# Patient Record
Sex: Female | Born: 1955
Health system: Southern US, Community
[De-identification: ages and names within clinical notes are randomized; demographics above are authoritative.]

## PROBLEM LIST (undated history)

## (undated) DIAGNOSIS — Z9221 Personal history of antineoplastic chemotherapy: Secondary | ICD-10-CM

## (undated) DIAGNOSIS — C801 Malignant (primary) neoplasm, unspecified: Secondary | ICD-10-CM

## (undated) DIAGNOSIS — F419 Anxiety disorder, unspecified: Secondary | ICD-10-CM

## (undated) DIAGNOSIS — C50919 Malignant neoplasm of unspecified site of unspecified female breast: Secondary | ICD-10-CM

## (undated) HISTORY — PX: AUGMENTATION MAMMAPLASTY: SUR837

---

## 1992-05-03 HISTORY — PX: MASTECTOMY: SHX3

## 1998-01-22 ENCOUNTER — Other Ambulatory Visit: Admission: RE | Admit: 1998-01-22 | Discharge: 1998-01-22 | Payer: Self-pay | Admitting: Obstetrics and Gynecology

## 1998-04-02 ENCOUNTER — Other Ambulatory Visit: Admission: RE | Admit: 1998-04-02 | Discharge: 1998-04-02 | Payer: Self-pay | Admitting: Obstetrics and Gynecology

## 1998-12-19 ENCOUNTER — Encounter (INDEPENDENT_AMBULATORY_CARE_PROVIDER_SITE_OTHER): Payer: Self-pay | Admitting: Specialist

## 1998-12-19 ENCOUNTER — Other Ambulatory Visit: Admission: RE | Admit: 1998-12-19 | Discharge: 1998-12-19 | Payer: Self-pay | Admitting: Obstetrics and Gynecology

## 1999-01-28 ENCOUNTER — Ambulatory Visit (HOSPITAL_COMMUNITY): Admission: RE | Admit: 1999-01-28 | Discharge: 1999-01-28 | Payer: Self-pay | Admitting: Gastroenterology

## 1999-01-28 ENCOUNTER — Encounter (INDEPENDENT_AMBULATORY_CARE_PROVIDER_SITE_OTHER): Payer: Self-pay | Admitting: Specialist

## 1999-02-02 ENCOUNTER — Other Ambulatory Visit: Admission: RE | Admit: 1999-02-02 | Discharge: 1999-02-02 | Payer: Self-pay | Admitting: Obstetrics and Gynecology

## 1999-10-22 ENCOUNTER — Encounter: Payer: Self-pay | Admitting: Oncology

## 1999-10-22 ENCOUNTER — Encounter: Admission: RE | Admit: 1999-10-22 | Discharge: 1999-10-22 | Payer: Self-pay | Admitting: Oncology

## 2000-03-01 ENCOUNTER — Other Ambulatory Visit: Admission: RE | Admit: 2000-03-01 | Discharge: 2000-03-01 | Payer: Self-pay | Admitting: Obstetrics and Gynecology

## 2000-05-03 HISTORY — PX: REDUCTION MAMMAPLASTY: SUR839

## 2000-07-05 ENCOUNTER — Encounter: Admission: RE | Admit: 2000-07-05 | Discharge: 2000-07-05 | Payer: Self-pay | Admitting: Oncology

## 2000-07-05 ENCOUNTER — Encounter: Payer: Self-pay | Admitting: Oncology

## 2000-08-22 ENCOUNTER — Encounter: Admission: RE | Admit: 2000-08-22 | Discharge: 2000-08-22 | Payer: Self-pay | Admitting: Family Medicine

## 2000-08-22 ENCOUNTER — Encounter: Payer: Self-pay | Admitting: Family Medicine

## 2000-09-23 ENCOUNTER — Inpatient Hospital Stay (HOSPITAL_COMMUNITY): Admission: RE | Admit: 2000-09-23 | Discharge: 2000-09-25 | Payer: Self-pay | Admitting: Plastic Surgery

## 2000-09-23 ENCOUNTER — Encounter (INDEPENDENT_AMBULATORY_CARE_PROVIDER_SITE_OTHER): Payer: Self-pay | Admitting: *Deleted

## 2000-12-14 ENCOUNTER — Ambulatory Visit (HOSPITAL_BASED_OUTPATIENT_CLINIC_OR_DEPARTMENT_OTHER): Admission: RE | Admit: 2000-12-14 | Discharge: 2000-12-14 | Payer: Self-pay | Admitting: Plastic Surgery

## 2001-01-31 ENCOUNTER — Encounter: Admission: RE | Admit: 2001-01-31 | Discharge: 2001-01-31 | Payer: Self-pay | Admitting: Oncology

## 2001-01-31 ENCOUNTER — Encounter: Payer: Self-pay | Admitting: Oncology

## 2001-03-07 ENCOUNTER — Other Ambulatory Visit: Admission: RE | Admit: 2001-03-07 | Discharge: 2001-03-07 | Payer: Self-pay | Admitting: Vascular Surgery

## 2001-07-05 ENCOUNTER — Encounter: Payer: Self-pay | Admitting: Family Medicine

## 2001-07-05 ENCOUNTER — Encounter: Admission: RE | Admit: 2001-07-05 | Discharge: 2001-07-05 | Payer: Self-pay | Admitting: Family Medicine

## 2002-02-02 ENCOUNTER — Encounter: Admission: RE | Admit: 2002-02-02 | Discharge: 2002-02-02 | Payer: Self-pay | Admitting: Oncology

## 2002-02-02 ENCOUNTER — Encounter: Payer: Self-pay | Admitting: Oncology

## 2002-03-08 ENCOUNTER — Other Ambulatory Visit: Admission: RE | Admit: 2002-03-08 | Discharge: 2002-03-08 | Payer: Self-pay | Admitting: Obstetrics and Gynecology

## 2003-02-13 ENCOUNTER — Encounter: Admission: RE | Admit: 2003-02-13 | Discharge: 2003-02-13 | Payer: Self-pay | Admitting: Oncology

## 2003-02-13 ENCOUNTER — Encounter: Payer: Self-pay | Admitting: Oncology

## 2003-03-26 ENCOUNTER — Other Ambulatory Visit: Admission: RE | Admit: 2003-03-26 | Discharge: 2003-03-26 | Payer: Self-pay | Admitting: Obstetrics and Gynecology

## 2004-02-14 ENCOUNTER — Encounter: Admission: RE | Admit: 2004-02-14 | Discharge: 2004-02-14 | Payer: Self-pay | Admitting: Oncology

## 2004-07-21 ENCOUNTER — Ambulatory Visit: Payer: Self-pay | Admitting: Oncology

## 2005-03-02 ENCOUNTER — Encounter: Admission: RE | Admit: 2005-03-02 | Discharge: 2005-03-02 | Payer: Self-pay | Admitting: Oncology

## 2005-08-03 ENCOUNTER — Ambulatory Visit: Payer: Self-pay | Admitting: Oncology

## 2005-08-03 LAB — COMPREHENSIVE METABOLIC PANEL
AST: 19 U/L (ref 0–37)
Albumin: 4.5 g/dL (ref 3.5–5.2)
Alkaline Phosphatase: 82 U/L (ref 39–117)
BUN: 14 mg/dL (ref 6–23)
Creatinine, Ser: 0.8 mg/dL (ref 0.4–1.2)
Potassium: 4.1 mEq/L (ref 3.5–5.3)

## 2005-08-03 LAB — CBC WITH DIFFERENTIAL/PLATELET
Basophils Absolute: 0 10*3/uL (ref 0.0–0.1)
EOS%: 1.3 % (ref 0.0–7.0)
HGB: 13.1 g/dL (ref 11.6–15.9)
MCH: 29.7 pg (ref 26.0–34.0)
MCV: 86.8 fL (ref 81.0–101.0)
MONO%: 6.6 % (ref 0.0–13.0)
RDW: 12.9 % (ref 11.3–14.5)

## 2005-10-13 ENCOUNTER — Emergency Department (HOSPITAL_COMMUNITY): Admission: EM | Admit: 2005-10-13 | Discharge: 2005-10-13 | Payer: Self-pay | Admitting: Emergency Medicine

## 2006-03-03 ENCOUNTER — Encounter: Admission: RE | Admit: 2006-03-03 | Discharge: 2006-03-03 | Payer: Self-pay | Admitting: Obstetrics and Gynecology

## 2007-03-06 ENCOUNTER — Encounter: Admission: RE | Admit: 2007-03-06 | Discharge: 2007-03-06 | Payer: Self-pay | Admitting: Obstetrics and Gynecology

## 2008-03-19 ENCOUNTER — Encounter: Admission: RE | Admit: 2008-03-19 | Discharge: 2008-03-19 | Payer: Self-pay | Admitting: Obstetrics and Gynecology

## 2009-04-01 ENCOUNTER — Encounter: Admission: RE | Admit: 2009-04-01 | Discharge: 2009-04-01 | Payer: Self-pay | Admitting: Family Medicine

## 2009-08-09 ENCOUNTER — Emergency Department (HOSPITAL_COMMUNITY): Admission: EM | Admit: 2009-08-09 | Discharge: 2009-08-09 | Payer: Self-pay | Admitting: Emergency Medicine

## 2009-08-15 ENCOUNTER — Ambulatory Visit (HOSPITAL_BASED_OUTPATIENT_CLINIC_OR_DEPARTMENT_OTHER): Admission: RE | Admit: 2009-08-15 | Discharge: 2009-08-15 | Payer: Self-pay | Admitting: Orthopedic Surgery

## 2010-04-02 ENCOUNTER — Encounter: Admission: RE | Admit: 2010-04-02 | Discharge: 2010-04-02 | Payer: Self-pay | Admitting: Obstetrics and Gynecology

## 2010-07-22 LAB — BASIC METABOLIC PANEL
BUN: 12 mg/dL (ref 6–23)
CO2: 31 mEq/L (ref 19–32)
Calcium: 9.5 mg/dL (ref 8.4–10.5)
Creatinine, Ser: 0.81 mg/dL (ref 0.4–1.2)
Glucose, Bld: 126 mg/dL — ABNORMAL HIGH (ref 70–99)
Sodium: 139 mEq/L (ref 135–145)

## 2010-09-18 NOTE — H&P (Signed)
[place]. [facility]  Patient:    Emma Johns, Emma Johns                        MRN: [id number] Adm. Date:  [id number] Attending:  Loura Halt Ii                         History and Physical  ADMISSION DIAGNOSES: 1. Left breast cancer. 2. Acquired absence of left breast. 3. Right breast hypertrophy. 4. Paradoxical tachycardia.  CHIEF COMPLAINT:  "I have lost my left breast and I wish to have breast reconstruction."  HISTORY OF PRESENT ILLNESS:  This is a 55 year old woman who developed left breast cancer in 1994.  She underwent a mastectomy followed by six months of chemotherapy.  At the time, she was not interested in breast reconstruction but now has found that the absence of her left breast has limited her activities.  She states that she feels very uncomfortable going out in the public such as the swimming pool or beach and for that reason, has decided to proceed with breast reconstruction.  Patient has opted to undergo a left transverse rectus abdominis myocutaneous flap.  Because she is large on the opposite side, she wishes to have a breast reduction and be reduced to approximately a C-cup breast.  PAST MEDICAL HISTORY:  Significant for anxiety attacks for which she has been on antidepressants in the past but no longer takes them.  She also has paradoxical tachycardia.  PAST SURGICAL HISTORY:  Mastectomy.  MEDICATIONS:  Atenolol, Prilosec, Allegra and an estrogen receptor-blocking drug.  SOCIAL HISTORY:  Patient is married with two children.  She lives at home with her family.  She denies the use of tobacco products.  REVIEW OF SYSTEMS:  Patient denies any history of cardiac problems other than occasional tachycardia, for which she takes atenolol.  She has no history of myocardial infarction or congestive heart failure.  She denies shortness of breath or dyspnea on exertion.  She denies diarrhea, constipation or blood in her stool.  She  has no difficulty with her urinary symptoms including dysuria or hematuria.  The patient has normal balance when walking and she denies any history of stroke.  PHYSICAL EXAMINATION:  GENERAL:  Exam reveals a well-developed, well-nourished woman in no distress.  HEENT:  Grossly within normal limits.  CHEST:  Clear to auscultation.  CARDIAC:  Normal S1 and S2, without murmurs or gallops.  No tachycardia today.  ABDOMEN:  Soft abdomen with no masses or tenderness.  There is adequate panniculus to reconstruct the left breast.  BREASTS:  Examination reveals surgical absence of the left breast.  The right breast is approximately a D-cup breast with no masses present.  GENITAL/RECTAL:  Deferred.  ASSESSMENT:  Acquired absence of left breast.  PLAN:  Surgical reconstruction of the left breast and right breast reduction. DD:  09/23/00 TD:  09/23/00 Job: [id number]

## 2010-09-18 NOTE — Op Note (Signed)
[place]. [facility]  Patient:    Emma Johns, Emma Johns                        MRN: [id number] Proc. Date: 09/23/00 Adm. Date:  [id number] Attending:  Loura Halt Ii CC:         Valentino Hue. Magrinat, M.D.   Operative Report  PREOPERATIVE DIAGNOSES: 1. Breast cancer. 2. Acquired absence of left breast. 3. Right breast hypertrophy.  POSTOPERATIVE DIAGNOSES: 1. Breast cancer. 2. Acquired absence of left breast. 3. Right breast hypertrophy.  PROCEDURES: 1. Right breast reduction with 458 g removed. 2. Left transverse rectus abdominis myocutaneous flap for delayed left-sided    breast reconstruction.  SURGEON:  Alfredia Ferguson, M.D.  FIRST ASSISTANT:  Janet Berlin. Dan Humphreys, M.D.  ANESTHESIA:  General endotracheal anesthesia.  INDICATION FOR SURGERY:  This is a 55 year old woman who underwent a left mastectomy in 1994.  She opted at that time not to have breast reconstruction but has now decided to proceed with breast reconstruction.  She would like to be reduced on her opposite side so as not to have two large breasts.  This would also be beneficial from the standpoint it would be hard to make a D cup breast with the amount of lower abdominal tissue she has.  She was counseled regarding the potential risks of breast reduction, including unsightly scarring, numbness of the nipple, being made too large, not being made large enough, not being reduced enough, infection, bleeding, fat necrosis, and overall dissatisfaction with the results.  On the TRAM side, she was instructed about the possibility of partial or complete loss of the flap, asymmetry compared to the right side, fat necrosis, unsightly scarring, abdominal wound healing problems, abdominal hernias and bulges, abdominal weakness, unsightly scarring, vascular compromise to the umbilicus, asymmetry of the abdomen, overall dissatisfaction with the results.  She understands that there is a possibility  of needing secondary surgery in the future for either complications or symmetry purposes.  DESCRIPTION OF PROCEDURE:  The day prior to surgery, skin marks had been placed outlining the breast reduction side in a Wise keyhole pattern.  The dimensions of the TRAM flap were also marked in the lower abdomen.  The patient was taken to the operating room, where she was given general endotracheal anesthesia.  I opted to do the breast reduction first.  An 8 cm wide pedicle was marked, which was inferiorly based.  A 45 mm diameter circle was drawn around the nipple.  This mark around the nipple was incised, leaving a 45 mm diameter of areola around the nipple.  The inferior pedicle was now marked, and the pedicle was de-epithelialized.  The pedicle was now dissected away from the medial, superior, and lateral breast flaps, flaring medially on the medial side and laterally on the lateral side to ensure adequate connections to the pectoralis fascia to ensure vascular integrity.  The medial, lateral, and superior breast flaps were now elevated off the pectoralis fascia.  The dermoglandular wedge medially was incised and dissected away from the medial breast flap.  The superior excess tissue at the location of the new nipple-areolar complex was also incised and then dissected obliquely in a superior direction, thinning the superior flap and removing the excess breast tissue.  Laterally the excess dermoglandular wedge was incised and dissected obliquely off the lateral breast flap, leaving the lateral breast flap approximately 3 cm in thickness.  The entire excess breast tissue  was now removed and passed off for pathology.  Hemostasis was accomplished throughout using electrocautery.  The dissected breast reduction site was copiously irrigated with saline irrigation and inspected for hemostasis.  The inferior corner of the vertical limb of my medial and lateral breast flaps were united to the midportion  of the inframammary crease using 2-0 Vicryl suture.  The superior corner of the vertical limb of the medial and lateral breast flap were also united to each other using 2-0 Vicryl suture.  The inframammary crease incision was closed using an interrupted 2-0 Vicryl and 3-0 Vicryl suture for the dermis.  The vertical incision was closed in a similar fashion.  The nipple-areolar complex was fixed in its new position using interrupted 3-0 Vicryl for the dermis and a running 3-0 Vicryl subcuticular for the dermis.  The area was cleansed, and Steri-Strips were applied to the incision.  Attention was now directed to the acquired absent side on the left.  The original mastectomy scar was now incised about 0.5 cm on either side of the scar, and an ellipse of old scar tissue was removed down to the level of the pectoralis fascia.  The superior and inferior breast flaps were now elevated to the dimensions of the original mastectomy.  I continued to dissect inferiorly at the medial inferior corner of the breast dissection in order to create a tunnel toward the costal margins on the left side.  An 8 cm wide tunnel was begun, and I dissected until it was too far to dissect from that side.  A circular incision was now made around the umbilicus, and the umbilical stalk was dissected away from the surrounding TRAM flap, leaving an adequate amount of fatty tissue on the stalk to ensure vascular integrity. The upper incision of the skin paddle of the TRAM was incised, and the incision was carried down to the anterior abdominal wall fascia.  The superior abdominal flap was elevated to the costal margins bilaterally and the xiphoid in the midline.  I was able to connect with the previously-placed tunnel to the mastectomy dissection on the left.  The patient was temporarily placed in a semi-Fowler position to ensure that closure could be accomplished with the abdominal incision at the anticipated location of the  lower incision.  Once I had assured that it could be closed without tension, the patient was returned  to a supine position.  The lower skin incision of the skin paddle was made and deepened through the skin and subcutaneous tissue until reaching the anterior abdominal wall fascia.  Hemostasis was accomplished throughout using electrocautery.  I opted to use the left rectus muscle to transfer the tissue. The right corner of the skin paddle was elevated off the anterior abdominal wall fascia from the lateral to medial direction until crossing the linea alba by approximately 0.5 cm.  The left corner of the skin paddle was dissected from a lateral to medial direction until reaching the lateral row of rectus perforators, which was approximately 3 cm medial to the lateral rectus fascia border.  Two parallel incisions were made in the anterior rectus fascia, beginning at the costal margins and separated from one another by approximately 2 cm.  The medial rectus fascia incision continued inferiorly, skirting along the medial connection of the skin paddle to the anterior rectus fascia on the left side.  The lateral rectus fascial incision continued inferiorly, skirting along the lateral skin paddle connection to the anterior rectus fascia.  The two incisions in  the rectus fascia met at the inferior connection of the skin paddle.  Leaving a strip of rectus fascia connected to the rectus muscle superiorly, the medial and lateral edge of the anterior rectus fascia was dissected off of the rectus muscle with care taken at the tendinous inscriptions.  The deep portion of the rectus muscle was now dissected off of the posterior rectus fascia using bipolar dissection.  The lateral intercostal perforators were divided between clips.  The deep inferior epigastric vessels were visualized.  The rectus muscle was divided at the arcuate line using electrocautery.  Attention was redirected to the  mastectomy defect, which was inspected once again for hemostasis.  Once assured, a 10 mm Blake drain was placed in the axillary gutter and brought out through a separate stab incision.  The deep inferior epigastric vessels were now ligated and divided with hemoclips.  The zone 4 of the skin paddle (right lateral one-fourth of skin paddle) was excised.  The entire skin paddle and its connected rectus muscle was tunneled up from the lower abdomen into the mastectomy defect and temporarily stapled in position.  There was no tension noted on the rectus muscle as it folded into the mastectomy defect.  The abdominal wound was copiously irrigated with warm saline irrigation and inspected for hemostasis.  Once hemostasis had been assured, closure of the anterior rectus fascia was carried out using multiple interrupted buried figure-of-eight 0 Prolene sutures.  A 6 x 6 inch piece of Marlex mesh was now placed over the rectus fascia closure and fixed in position as an onlay graft using a running 2-0 Prolene suture.  An opening was made in the central portion of the Marlex mesh, and the umbilicus was brought through this new opening.  The wound was once again irrigated and hemostasis was assured.  Two Blake drains were placed through the lower abdomen and brought out through separate stab incisions.  The patient was placed in a semi-Fowler position with the head elevated at 30 degrees and the knees flexed.  The abdominal wound was closed beginning centrally using interrupted 2-0 Vicryl sutures for the dermis.  A new opening for the umbilicus was made before closing the abdomen completely.  The umbilicus was brought through this new opening and fixed in position using interrupted 3-0 Vicryl suture for the dermis.  The remainder of the abdominal wound was closed using multiple interrupted 2-0 Vicryl sutures for the dermis.  The abdomen was now cleansed, dried, and Steri-Strips were applied to the  abdominal incision, and the lower abdomen was covered up with towels.  The TRAM flap was now inspected.  It was warm pink, and had excellent circulation.  It was a little large, and I opted to trim a bit more from the medial side of the skin paddle, which was farthest away from the rectus muscle.  Once I was happy with the amount of tissue left, the superior portion of the skin paddle was de-epithelialized after marking it with the superior breast flap covering the amount of skin which could be removed.  The superior skin paddle was now suspended from the upper limits of the mastectomy dissection using multiple interrupted 3-0 Vicryl sutures.  The lower edge of the skin paddle abutted nicely to the cut edge of the inferior breast flap.  These two edges were fixed together using multiple interrupted 3-0 Vicryl sutures for the dermis.  The superior skin paddle edge was closed to the cut edge of the superior breast flap using  a running 3-0 Vicryl subcuticular.  Symmetry appeared to be good.  Capillary refill was excellent in the flap after completely closing it.  The area was cleansed, and Steri-Strips were applied to the incision.  A dressing was placed over the right reduced breast and the left reconstructed breast.  A dressing was also then placed on the abdominal wound.  The patient tolerated the procedure well with an estimated blood loss of 550 cc.  She was awakened, extubated, and transported to the recovery room in satisfactory condition. DD:  09/23/00 TD:  09/24/00 Job: [id number]

## 2010-09-18 NOTE — Op Note (Signed)
[place]. [facility]  Patient:    Emma Johns, Emma Johns. Visit Number: [id number] MRN: [id number]          Service Type: Attending:  Alfredia Ferguson, M.D. Proc. Date: 12/19/00                             Operative Report  PREOPERATIVE DIAGNOSES: 1. Left breast cancer. 2. Acquired absence, left breast.  POSTOPERATIVE DIAGNOSES: 1. Left breast cancer. 2. Acquired absence, left breast.  PROCEDURE:  Left nipple reconstruction with tripartate flap.  SURGEON:  Alfredia Ferguson, M.D.  ANESTHESIA:  None required.  INDICATION FOR SURGERY:  This is a 55 year old woman who is status post left mastectomy followed by TRAM flap reconstruction.  She now presents for nipple reconstruction.  She understands the risks of surgery, including necrosis of the nipple.  She understands that there are chances of asymmetry and that the nipple will also shrink significantly over the coming months.  In spite of these known risks, she wishes to proceed with the surgery.  DESCRIPTION OF PROCEDURE:  Location of the nipple was picked with the patient in a sitting position to try to match as closely as possible the opposite breast.  The patient was placed in a supine position, and a 42 mm diameter circle was drawn around the location of the nipple.  Within the confines of this circle, a tripartate flap which was inferiorly based was designed.  The three points of the flap pointed to the 3 oclock, the 12 oclock, and 9 oclock position, with a 2 cm wide skin base left intact for vascular integrity.  The left TRAM was now prepped and draped in a sterile fashion.  An incision was made in the designed tripartate flap down to the level of subcutaneous tissue.  The entire flap was elevated based on the 2 cm inferiorly-based skin pedicle.  The tips of each of the three flaps were now removed to create blunt ends.  The two flaps pointing to the 3 oclock and 9 oclock position were rolled toward  one another and fixed to each other using interrupted 4-0 chromic suture.  The flap which was pointing to the 12 oclock position was closed down on top of the previously-united flaps to create a closed cylinder.  The donor site was now closed with multiple interrupted 4-0 PDS.  The skin edges were closed using interrupted 4-0 chromic suture.  The vascularity of the nipple looked good.  The reconstructed breast was now cleansed, dried, and a bulky dressing was applied.  The patient tolerated the procedure well with minimal blood loss.  She was discharged to home in satisfactory condition. Attending:  Alfredia Ferguson, M.D. DD:  12/19/00 TD:  12/19/00 Job: [phone] [id number]

## 2010-09-18 NOTE — Discharge Summary (Signed)
[place]. [facility]  Patient:    Emma Johns, Emma Johns                        MRN: [id number] Adm. Date:  [id number] Disc. Date: [id number] Attending:  Loura Halt Ii                           Discharge Summary  ADMISSION DIAGNOSES: 1. Left breast cancer. 2. Acquired absence left breast. 3. Right breast hypertrophy. 4. Paradoxical tachycardia.  DISCHARGE DIAGNOSES: 1. Left breast cancer. 2. Acquired absence left breast. 3. Right breast hypertrophy. 4. Paradoxical tachycardia.  OPERATIONS PERFORMED: 1. Right breast reduction. 2. Delayed breast reconstruction with left transverse rectus abdominis    myocutaneous flap, both procedures performed on Sep 23, 2000.  CHIEF COMPLAINT:  "I have lost my left breast due to cancer, and I wish to have breast reconstruction."  HISTORY OF PRESENT ILLNESS:  This is a 55 year old woman who underwent a mastectomy in 1994.  At that time she opted not to have breast reconstruction, but has reconsidered over the years and wishes now to proceed with elective breast reconstruction.  Because her right breast is so large, it is going to necessitate doing a right breast reduction in order to achieve symmetry.  The patient wishes to proceed with elective right breast reduction and left breast reconstruction.  The patient was counseled in the office regarding the potential risks of the surgery.  In spite of that, she wishes to proceed with the operation.  PAST MEDICAL HISTORY:  Significant for anxiety attacks for which she has been on antidepressants in the past, but no longer requires.  She also has paradoxical tachycardia for which she takes atenolol.  PAST SURGICAL HISTORY:  Mastectomy in 1994.  MEDICATIONS:  Atenolol, Prilosec, Allegra, and an estrogen blocking drug.  SOCIAL HISTORY:  The patient is married and has two children.  She lives at home with her family.  She denies the use of tobacco products.  REVIEW OF  SYSTEMS:  See H&P for complete review of systems.  PHYSICAL EXAMINATION:  Please see H&P for complete physical exam.  ADMISSION LABORATORY VALUES:  Hemoglobin 13.1, hematocrit 37.4.  The patients electrolytes were normal.  X-RAYS:  Chest x-ray reveals no active disease.  Cardiogram revealed a sinus bradycardia with a rate of 46.  HOSPITAL COURSE:  On the day of admission the patient was taken to the operating room where she underwent right breast reduction and a delayed reconstruction with a left transverse rectus abdominis myocutaneous flap. Postoperatively, the patient has had an uneventful course.  On the second postoperative evening she did spike a fever to 101.3 which responded nicely to incentive spirometry.  She is tolerating a diet without problems.  She was ambulating with assistance.  She was able to void without difficulty.  Postoperative hematocrit is 27.9.  I am going to encourage the patient to take multivitamins with iron postoperatively to help build her hemoglobin back up.  All dressings were removed on the second postoperative day.  The flap looks good, soft, and warm.  The abdominal incision is healing well.  The right reduced breast is slightly larger than the TRAM flap, but should shrink somewhat with resolution of the swelling.  The patient is being discharged in the late afternoon of the second postoperative day.  Followup will be provided in four days for removal of drains.  POSTOPERATIVE INSTRUCTIONS:  Patient to empty her drains twice daily.  She will also place dressings around the drains.  DISCHARGE MEDICATIONS: 1. Tylox 1-2 q.4h. as needed for pain. 2. Keflex 500 mg q.i.d. for 5 days.  The patient understands her instructions and is willing to comply. DD:  09/25/00 TD:  09/26/00 Job: [id number]

## 2010-11-18 ENCOUNTER — Other Ambulatory Visit: Payer: Self-pay | Admitting: Obstetrics & Gynecology

## 2011-04-02 ENCOUNTER — Other Ambulatory Visit: Payer: Self-pay | Admitting: Obstetrics & Gynecology

## 2011-04-02 DIAGNOSIS — Z1231 Encounter for screening mammogram for malignant neoplasm of breast: Secondary | ICD-10-CM

## 2011-05-05 ENCOUNTER — Ambulatory Visit
Admission: RE | Admit: 2011-05-05 | Discharge: 2011-05-05 | Disposition: A | Discharge status: Home / Self Care | Payer: Commercial Managed Care - PPO | Source: Ambulatory Visit | Attending: Obstetrics & Gynecology | Admitting: Obstetrics & Gynecology

## 2011-05-05 DIAGNOSIS — Z1231 Encounter for screening mammogram for malignant neoplasm of breast: Secondary | ICD-10-CM

## 2012-03-29 ENCOUNTER — Other Ambulatory Visit: Payer: Self-pay | Admitting: Obstetrics & Gynecology

## 2012-03-29 DIAGNOSIS — Z9011 Acquired absence of right breast and nipple: Secondary | ICD-10-CM

## 2012-03-29 DIAGNOSIS — Z1231 Encounter for screening mammogram for malignant neoplasm of breast: Secondary | ICD-10-CM

## 2012-03-29 DIAGNOSIS — Z853 Personal history of malignant neoplasm of breast: Secondary | ICD-10-CM

## 2012-03-29 DIAGNOSIS — Z9012 Acquired absence of left breast and nipple: Secondary | ICD-10-CM

## 2012-05-05 ENCOUNTER — Ambulatory Visit
Admission: RE | Admit: 2012-05-05 | Discharge: 2012-05-05 | Disposition: A | Discharge status: Home / Self Care | Payer: BC Managed Care – PPO | Source: Ambulatory Visit | Attending: Obstetrics & Gynecology | Admitting: Obstetrics & Gynecology

## 2012-05-05 DIAGNOSIS — Z853 Personal history of malignant neoplasm of breast: Secondary | ICD-10-CM

## 2012-05-05 DIAGNOSIS — Z9012 Acquired absence of left breast and nipple: Secondary | ICD-10-CM

## 2012-05-05 DIAGNOSIS — Z1231 Encounter for screening mammogram for malignant neoplasm of breast: Secondary | ICD-10-CM

## 2012-12-13 ENCOUNTER — Other Ambulatory Visit: Payer: Self-pay | Admitting: Family Medicine

## 2012-12-13 DIAGNOSIS — R1011 Right upper quadrant pain: Secondary | ICD-10-CM

## 2012-12-18 ENCOUNTER — Ambulatory Visit
Admission: RE | Admit: 2012-12-18 | Discharge: 2012-12-18 | Disposition: A | Discharge status: Home / Self Care | Payer: BC Managed Care – PPO | Source: Ambulatory Visit | Attending: Family Medicine | Admitting: Family Medicine

## 2012-12-18 DIAGNOSIS — R1011 Right upper quadrant pain: Secondary | ICD-10-CM

## 2013-05-14 ENCOUNTER — Other Ambulatory Visit: Payer: Self-pay

## 2013-05-14 DIAGNOSIS — Z1231 Encounter for screening mammogram for malignant neoplasm of breast: Secondary | ICD-10-CM

## 2013-05-14 DIAGNOSIS — Z853 Personal history of malignant neoplasm of breast: Secondary | ICD-10-CM

## 2013-05-14 DIAGNOSIS — Z9012 Acquired absence of left breast and nipple: Secondary | ICD-10-CM

## 2013-05-16 ENCOUNTER — Ambulatory Visit
Admission: RE | Admit: 2013-05-16 | Discharge: 2013-05-16 | Disposition: A | Discharge status: Home / Self Care | Payer: BC Managed Care – PPO | Source: Ambulatory Visit

## 2013-05-16 DIAGNOSIS — Z9012 Acquired absence of left breast and nipple: Secondary | ICD-10-CM

## 2013-05-16 DIAGNOSIS — Z1231 Encounter for screening mammogram for malignant neoplasm of breast: Secondary | ICD-10-CM

## 2013-05-16 DIAGNOSIS — Z853 Personal history of malignant neoplasm of breast: Secondary | ICD-10-CM

## 2014-06-18 ENCOUNTER — Other Ambulatory Visit: Payer: Self-pay

## 2014-06-18 DIAGNOSIS — Z1231 Encounter for screening mammogram for malignant neoplasm of breast: Secondary | ICD-10-CM

## 2014-06-18 DIAGNOSIS — Z9012 Acquired absence of left breast and nipple: Secondary | ICD-10-CM

## 2014-07-17 ENCOUNTER — Ambulatory Visit
Admission: RE | Admit: 2014-07-17 | Discharge: 2014-07-17 | Disposition: A | Discharge status: Home / Self Care | Payer: 59 | Source: Ambulatory Visit

## 2014-07-17 DIAGNOSIS — Z9012 Acquired absence of left breast and nipple: Secondary | ICD-10-CM

## 2014-07-17 DIAGNOSIS — Z1231 Encounter for screening mammogram for malignant neoplasm of breast: Secondary | ICD-10-CM

## 2014-08-06 ENCOUNTER — Other Ambulatory Visit: Payer: Self-pay | Admitting: Obstetrics & Gynecology

## 2014-08-06 DIAGNOSIS — M8580 Other specified disorders of bone density and structure, unspecified site: Secondary | ICD-10-CM

## 2015-06-30 ENCOUNTER — Other Ambulatory Visit: Payer: Self-pay

## 2015-06-30 DIAGNOSIS — Z1231 Encounter for screening mammogram for malignant neoplasm of breast: Secondary | ICD-10-CM

## 2015-07-21 ENCOUNTER — Ambulatory Visit
Admission: RE | Admit: 2015-07-21 | Discharge: 2015-07-21 | Disposition: A | Discharge status: Home / Self Care | Payer: Managed Care, Other (non HMO) | Source: Ambulatory Visit

## 2015-07-21 DIAGNOSIS — Z1231 Encounter for screening mammogram for malignant neoplasm of breast: Secondary | ICD-10-CM

## 2016-07-20 ENCOUNTER — Other Ambulatory Visit: Payer: Self-pay | Admitting: Family Medicine

## 2016-07-20 DIAGNOSIS — Z1231 Encounter for screening mammogram for malignant neoplasm of breast: Secondary | ICD-10-CM

## 2016-07-22 ENCOUNTER — Ambulatory Visit
Admission: RE | Admit: 2016-07-22 | Discharge: 2016-07-22 | Disposition: A | Discharge status: Home / Self Care | Payer: Managed Care, Other (non HMO) | Source: Ambulatory Visit | Attending: Family Medicine | Admitting: Family Medicine

## 2016-07-22 DIAGNOSIS — Z1231 Encounter for screening mammogram for malignant neoplasm of breast: Secondary | ICD-10-CM

## 2016-07-22 HISTORY — DX: Personal history of antineoplastic chemotherapy: Z92.21

## 2017-07-08 ENCOUNTER — Other Ambulatory Visit: Payer: Self-pay | Admitting: Family Medicine

## 2017-07-08 DIAGNOSIS — Z1231 Encounter for screening mammogram for malignant neoplasm of breast: Secondary | ICD-10-CM

## 2017-08-02 ENCOUNTER — Ambulatory Visit
Admission: RE | Admit: 2017-08-02 | Discharge: 2017-08-02 | Disposition: A | Discharge status: Home / Self Care | Payer: 59 | Source: Ambulatory Visit | Attending: Family Medicine | Admitting: Family Medicine

## 2017-08-02 DIAGNOSIS — Z1231 Encounter for screening mammogram for malignant neoplasm of breast: Secondary | ICD-10-CM

## 2019-03-15 ENCOUNTER — Other Ambulatory Visit: Payer: Self-pay | Admitting: Family Medicine

## 2019-03-15 DIAGNOSIS — M8580 Other specified disorders of bone density and structure, unspecified site: Secondary | ICD-10-CM

## 2019-06-08 ENCOUNTER — Other Ambulatory Visit: Payer: Self-pay

## 2019-06-08 ENCOUNTER — Ambulatory Visit
Admission: RE | Admit: 2019-06-08 | Discharge: 2019-06-08 | Disposition: A | Discharge status: Home / Self Care | Payer: Medicare Other | Source: Ambulatory Visit | Attending: Family Medicine | Admitting: Family Medicine

## 2019-06-08 DIAGNOSIS — M8580 Other specified disorders of bone density and structure, unspecified site: Secondary | ICD-10-CM

## 2019-07-17 ENCOUNTER — Other Ambulatory Visit: Payer: Self-pay | Admitting: Family Medicine

## 2019-07-17 DIAGNOSIS — Z1231 Encounter for screening mammogram for malignant neoplasm of breast: Secondary | ICD-10-CM

## 2019-08-07 ENCOUNTER — Other Ambulatory Visit: Payer: Self-pay

## 2019-08-07 ENCOUNTER — Ambulatory Visit
Admission: RE | Admit: 2019-08-07 | Discharge: 2019-08-07 | Disposition: A | Discharge status: Home / Self Care | Payer: Medicare Other | Source: Ambulatory Visit | Attending: Family Medicine | Admitting: Family Medicine

## 2019-08-07 DIAGNOSIS — Z1231 Encounter for screening mammogram for malignant neoplasm of breast: Secondary | ICD-10-CM

## 2019-08-08 ENCOUNTER — Other Ambulatory Visit: Payer: Self-pay | Admitting: Family Medicine

## 2019-08-08 DIAGNOSIS — R928 Other abnormal and inconclusive findings on diagnostic imaging of breast: Secondary | ICD-10-CM

## 2019-08-12 ENCOUNTER — Emergency Department (HOSPITAL_BASED_OUTPATIENT_CLINIC_OR_DEPARTMENT_OTHER)
Admission: EM | Admit: 2019-08-12 | Discharge: 2019-08-12 | Disposition: A | Discharge status: Home / Self Care | Payer: Medicare Other | Attending: Emergency Medicine | Admitting: Emergency Medicine

## 2019-08-12 ENCOUNTER — Other Ambulatory Visit: Payer: Self-pay

## 2019-08-12 ENCOUNTER — Encounter (HOSPITAL_BASED_OUTPATIENT_CLINIC_OR_DEPARTMENT_OTHER): Payer: Self-pay | Admitting: *Deleted

## 2019-08-12 DIAGNOSIS — R110 Nausea: Secondary | ICD-10-CM | POA: Diagnosis not present

## 2019-08-12 DIAGNOSIS — Z853 Personal history of malignant neoplasm of breast: Secondary | ICD-10-CM | POA: Diagnosis not present

## 2019-08-12 DIAGNOSIS — Z79899 Other long term (current) drug therapy: Secondary | ICD-10-CM | POA: Insufficient documentation

## 2019-08-12 DIAGNOSIS — R450 Nervousness: Secondary | ICD-10-CM | POA: Diagnosis present

## 2019-08-12 DIAGNOSIS — Z9221 Personal history of antineoplastic chemotherapy: Secondary | ICD-10-CM | POA: Diagnosis not present

## 2019-08-12 DIAGNOSIS — R0789 Other chest pain: Secondary | ICD-10-CM | POA: Insufficient documentation

## 2019-08-12 DIAGNOSIS — F419 Anxiety disorder, unspecified: Secondary | ICD-10-CM | POA: Diagnosis not present

## 2019-08-12 DIAGNOSIS — R002 Palpitations: Secondary | ICD-10-CM | POA: Diagnosis not present

## 2019-08-12 DIAGNOSIS — R0602 Shortness of breath: Secondary | ICD-10-CM | POA: Insufficient documentation

## 2019-08-12 HISTORY — DX: Anxiety disorder, unspecified: F41.9

## 2019-08-12 HISTORY — DX: Malignant (primary) neoplasm, unspecified: C80.1

## 2019-08-12 HISTORY — DX: Malignant neoplasm of unspecified site of unspecified female breast: C50.919

## 2019-08-12 MED ORDER — HYDROXYZINE HCL 25 MG PO TABS
25.0000 mg | ORAL_TABLET | Freq: Once | ORAL | Status: AC
  Administered 2019-08-12: 25 mg via ORAL
  Filled 2019-08-12: qty 1

## 2019-08-12 MED ORDER — HYDROXYZINE PAMOATE 25 MG PO CAPS: 25.0000 mg | ORAL_CAPSULE | Freq: Three times a day (TID) | ORAL | 0 refills | Status: AC | PRN

## 2019-08-12 NOTE — ED Triage Notes (Signed)
Pt reports she started taking zoloft yesterday for anxiety. Reports shaking, "feeling hot", stomach pressure and nausea, "feel like I can't breathe" since starting med. She took 0.25mg  xanax today per direction from her doctor with minimal relief

## 2019-08-12 NOTE — ED Provider Notes (Signed)
[facility] EMERGENCY DEPARTMENT Provider Note  CSN: [id number] Arrival date & time: 08/12/19 1601    History Chief Complaint  Patient presents with  . Medication Reaction    HPI  Emma Johns is a 64 y.o. female with a history of anxiety had been well controlled for some time taking Xanax 0.125-0.25mg  on rare occasions. Recently she has been experiencing more symptoms of jitteriness, palpitations, chest pressure and nausea that she and husband attribute to her adult daughter and her family moving out of town with the patient's grandson. She was seen at her PCP recently for same and had a new Rx for Zoloft which she began taking yesterday morning. She had some mild jitteriness during the day yesterday but this afternoon after she woke up from a nap she was feeling very nervous, jittery and hot. She had some stomach pressure and nausea and felt like she couldn't breath. She took a 0.25mg  Xanax and asked her husband to bring her to the ED. She was feeling some better by the time of my evaluation.    Past Medical History:  Diagnosis Date  . Anxiety   . Breast cancer ([place])   . Cancer ([place])   . Personal history of chemotherapy     Past Surgical History:  Procedure Laterality Date  . AUGMENTATION MAMMAPLASTY     reduction right breast 2002  . MASTECTOMY Left 1994  . REDUCTION MAMMAPLASTY Right 2002    No family history on file.  Social History   Tobacco Use  . Smoking status: Never Smoker  . Smokeless tobacco: Never Used  Substance Use Topics  . Alcohol use: Yes    Comment: rare  . Drug use: Never     Home Medications Prior to Admission medications   Medication Sig Start Date End Date Taking? Authorizing Provider  ALPRAZolam Duanne Moron) 0.5 MG tablet Take 0.5 mg by mouth every 6 (six) hours as needed. 07/23/19   [provider]  Clobetasol Propionate (TEMOVATE) 0.05 % external spray APPLY TOPICALLY TO THE AFFECTED AREA EVERY DAY 04/30/19   [provider]  hydrOXYzine (VISTARIL) 25 MG capsule Take 1 capsule (25 mg total) by mouth 3 (three) times daily as needed. 08/12/19   Truddie Hidden, MD  rosuvastatin (CRESTOR) 5 MG tablet Take 5 mg by mouth daily. 07/15/19   [provider]     Allergies    Codeine and Prochlorperazine   Review of Systems   Review of Systems  Constitutional: Negative for fever.  HENT: Negative for congestion and sore throat.   Respiratory: Positive for shortness of breath. Negative for cough.   Cardiovascular: Negative for chest pain.  Gastrointestinal: Positive for nausea. Negative for abdominal pain, diarrhea and vomiting.  Genitourinary: Negative for dysuria.  Musculoskeletal: Negative for myalgias.  Skin: Negative for rash.  Neurological: Negative for headaches.  Psychiatric/Behavioral: Negative for behavioral problems. The patient is nervous/anxious.      Physical Exam BP (!) 177/100 (BP Location: Right Arm)   Pulse 86   Temp 97.7 F (36.5 C) (Oral)   Resp 18   SpO2 98%   Physical Exam Constitutional:      Appearance: Normal appearance.  HENT:     Head: Normocephalic and atraumatic.     Nose: Nose normal.     Mouth/Throat:     Mouth: Mucous membranes are moist.  Eyes:     Extraocular Movements: Extraocular movements intact.     Conjunctiva/sclera: Conjunctivae normal.  Cardiovascular:  Rate and Rhythm: Normal rate.  Pulmonary:     Effort: Pulmonary effort is normal.     Breath sounds: Normal breath sounds.  Abdominal:     General: Abdomen is flat.     Palpations: Abdomen is soft.     Tenderness: There is no abdominal tenderness.  Musculoskeletal:        General: No swelling. Normal range of motion.     Cervical back: Neck supple.  Skin:    General: Skin is warm and dry.  Neurological:     General: No focal deficit present.     Mental Status: She is alert.  Psychiatric:     Comments: anxious      ED Results / Procedures / Treatments   Labs (all  labs ordered are listed, but only abnormal results are displayed) Labs Reviewed - No data to display  EKG None   Radiology No results found.  Procedures Procedures  Medications Ordered in the ED Medications  hydrOXYzine (ATARAX/VISTARIL) tablet 25 mg (25 mg Oral Given 08/12/19 1717)     ED Course  I have reviewed the triage vital signs and the nursing notes.  Pertinent labs & imaging results that were available during my care of the patient were reviewed by me and considered in my medical decision making (see chart for details).  Clinical Course as of Aug 11 1745  Sun Aug 12, 2019  1703 Patient here with symptoms consistent with anxiety/panic attack, similar to previous but more pronounced today after starting Zoloft yesterday. Less likely a true allergic reaction to the Zoloft or an acute medical issue, however I offered the patient a medical workup if that would help alleviate her concerns, but she has declined. She is more calm and less anxious as I spoke with her and her husband about her symptoms and the fact that Zoloft may take 2 weeks to begin helping with her symptoms. She would like to try a dose of Vistaril to see if that will help with her residual symptoms.    [CS]  [id number] Patient reports improvement in symptoms after Vistaril and would like to go home. Will give Rx for Vistaril to take as need. Advised to call PCP tomorrow to discuss continuing Zoloft vs dose adjustment vs alternative meds. RTED for any other concerns.    [CS]    Clinical Course User Index [CS] Truddie Hidden, MD    MDM Rules/Calculators/A&P MDM  Final Clinical Impression(s) / ED Diagnoses Final diagnoses:  Anxiety    Rx / DC Orders ED Discharge Orders         Ordered    hydrOXYzine (VISTARIL) 25 MG capsule  3 times daily PRN     08/12/19 1746           Truddie Hidden, MD 08/12/19 1747

## 2019-08-12 NOTE — ED Notes (Signed)
Pt verbalized understanding of dc instructions.

## 2019-08-15 ENCOUNTER — Ambulatory Visit
Admission: RE | Admit: 2019-08-15 | Discharge: 2019-08-15 | Disposition: A | Discharge status: Home / Self Care | Payer: Medicare Other | Source: Ambulatory Visit | Attending: Family Medicine | Admitting: Family Medicine

## 2019-08-15 ENCOUNTER — Other Ambulatory Visit: Payer: Self-pay

## 2019-08-15 DIAGNOSIS — R928 Other abnormal and inconclusive findings on diagnostic imaging of breast: Secondary | ICD-10-CM

## 2019-10-17 ENCOUNTER — Other Ambulatory Visit: Payer: Self-pay

## 2020-01-29 ENCOUNTER — Other Ambulatory Visit: Payer: Self-pay | Admitting: Physician Assistant

## 2020-01-29 NOTE — Telephone Encounter (Signed)
Wants refill on clobetasol. [facility] pharmacy said they tried to reach [facility] but were unable to. Pharmacy: [facility] (mail order pharmacy)

## 2020-01-30 NOTE — Telephone Encounter (Signed)
Called patient to let her know that she needs office visit before we can refill any medications.

## 2020-04-07 ENCOUNTER — Ambulatory Visit: Payer: Medicare Other | Admitting: Dermatology

## 2020-05-12 DIAGNOSIS — H109 Unspecified conjunctivitis: Secondary | ICD-10-CM | POA: Diagnosis not present

## 2020-06-17 DIAGNOSIS — Z1211 Encounter for screening for malignant neoplasm of colon: Secondary | ICD-10-CM | POA: Diagnosis not present

## 2020-06-17 DIAGNOSIS — R1013 Epigastric pain: Secondary | ICD-10-CM | POA: Diagnosis not present

## 2020-06-17 DIAGNOSIS — K219 Gastro-esophageal reflux disease without esophagitis: Secondary | ICD-10-CM | POA: Diagnosis not present

## 2020-06-17 DIAGNOSIS — K449 Diaphragmatic hernia without obstruction or gangrene: Secondary | ICD-10-CM | POA: Diagnosis not present

## 2020-06-17 DIAGNOSIS — K2960 Other gastritis without bleeding: Secondary | ICD-10-CM | POA: Diagnosis not present

## 2020-07-02 DIAGNOSIS — K5792 Diverticulitis of intestine, part unspecified, without perforation or abscess without bleeding: Secondary | ICD-10-CM | POA: Diagnosis not present

## 2020-07-15 ENCOUNTER — Other Ambulatory Visit: Payer: Self-pay | Admitting: Family Medicine

## 2020-07-15 DIAGNOSIS — Z1231 Encounter for screening mammogram for malignant neoplasm of breast: Secondary | ICD-10-CM

## 2020-08-06 DIAGNOSIS — R040 Epistaxis: Secondary | ICD-10-CM | POA: Diagnosis not present

## 2020-08-11 DIAGNOSIS — L409 Psoriasis, unspecified: Secondary | ICD-10-CM | POA: Diagnosis not present

## 2020-08-11 DIAGNOSIS — L2084 Intrinsic (allergic) eczema: Secondary | ICD-10-CM | POA: Diagnosis not present

## 2020-09-05 ENCOUNTER — Ambulatory Visit
Admission: RE | Admit: 2020-09-05 | Discharge: 2020-09-05 | Disposition: A | Discharge status: Home / Self Care | Payer: Medicare Other | Source: Ambulatory Visit | Attending: Family Medicine | Admitting: Family Medicine

## 2020-09-05 ENCOUNTER — Other Ambulatory Visit: Payer: Self-pay | Admitting: Family Medicine

## 2020-09-05 ENCOUNTER — Other Ambulatory Visit: Payer: Self-pay

## 2020-09-05 DIAGNOSIS — Z1231 Encounter for screening mammogram for malignant neoplasm of breast: Secondary | ICD-10-CM | POA: Diagnosis not present

## 2020-09-15 DIAGNOSIS — E7800 Pure hypercholesterolemia, unspecified: Secondary | ICD-10-CM | POA: Diagnosis not present

## 2020-09-15 DIAGNOSIS — K589 Irritable bowel syndrome without diarrhea: Secondary | ICD-10-CM | POA: Diagnosis not present

## 2020-09-15 DIAGNOSIS — M79604 Pain in right leg: Secondary | ICD-10-CM | POA: Diagnosis not present

## 2020-09-15 DIAGNOSIS — I10 Essential (primary) hypertension: Secondary | ICD-10-CM | POA: Diagnosis not present

## 2020-10-03 DIAGNOSIS — Z20822 Contact with and (suspected) exposure to covid-19: Secondary | ICD-10-CM | POA: Diagnosis not present

## 2020-10-07 DIAGNOSIS — R030 Elevated blood-pressure reading, without diagnosis of hypertension: Secondary | ICD-10-CM | POA: Diagnosis not present

## 2020-10-07 DIAGNOSIS — J069 Acute upper respiratory infection, unspecified: Secondary | ICD-10-CM | POA: Diagnosis not present

## 2020-12-25 DIAGNOSIS — H6123 Impacted cerumen, bilateral: Secondary | ICD-10-CM | POA: Diagnosis not present

## 2020-12-25 DIAGNOSIS — Z974 Presence of external hearing-aid: Secondary | ICD-10-CM | POA: Diagnosis not present

## 2020-12-25 DIAGNOSIS — H6983 Other specified disorders of Eustachian tube, bilateral: Secondary | ICD-10-CM | POA: Diagnosis not present

## 2021-03-24 DIAGNOSIS — G2581 Restless legs syndrome: Secondary | ICD-10-CM | POA: Diagnosis not present

## 2021-03-24 DIAGNOSIS — K219 Gastro-esophageal reflux disease without esophagitis: Secondary | ICD-10-CM | POA: Diagnosis not present

## 2021-03-24 DIAGNOSIS — K589 Irritable bowel syndrome without diarrhea: Secondary | ICD-10-CM | POA: Diagnosis not present

## 2021-03-24 DIAGNOSIS — I350 Nonrheumatic aortic (valve) stenosis: Secondary | ICD-10-CM | POA: Diagnosis not present

## 2021-03-24 DIAGNOSIS — Z1389 Encounter for screening for other disorder: Secondary | ICD-10-CM | POA: Diagnosis not present

## 2021-03-24 DIAGNOSIS — E7800 Pure hypercholesterolemia, unspecified: Secondary | ICD-10-CM | POA: Diagnosis not present

## 2021-03-24 DIAGNOSIS — Z23 Encounter for immunization: Secondary | ICD-10-CM | POA: Diagnosis not present

## 2021-03-24 DIAGNOSIS — Z0000 Encounter for general adult medical examination without abnormal findings: Secondary | ICD-10-CM | POA: Diagnosis not present

## 2021-03-24 DIAGNOSIS — J309 Allergic rhinitis, unspecified: Secondary | ICD-10-CM | POA: Diagnosis not present

## 2021-06-10 ENCOUNTER — Ambulatory Visit
Admission: RE | Admit: 2021-06-10 | Discharge: 2021-06-10 | Disposition: A | Discharge status: Home / Self Care | Payer: Medicare Other | Source: Ambulatory Visit | Attending: Internal Medicine | Admitting: Internal Medicine

## 2021-06-10 ENCOUNTER — Other Ambulatory Visit: Payer: Self-pay | Admitting: Internal Medicine

## 2021-06-10 DIAGNOSIS — M898X1 Other specified disorders of bone, shoulder: Secondary | ICD-10-CM

## 2021-06-10 DIAGNOSIS — M19012 Primary osteoarthritis, left shoulder: Secondary | ICD-10-CM | POA: Diagnosis not present

## 2021-06-24 DIAGNOSIS — M898X1 Other specified disorders of bone, shoulder: Secondary | ICD-10-CM | POA: Diagnosis not present

## 2021-06-24 DIAGNOSIS — R030 Elevated blood-pressure reading, without diagnosis of hypertension: Secondary | ICD-10-CM | POA: Diagnosis not present

## 2021-06-30 DIAGNOSIS — H16223 Keratoconjunctivitis sicca, not specified as Sjogren's, bilateral: Secondary | ICD-10-CM | POA: Diagnosis not present

## 2021-06-30 DIAGNOSIS — H40033 Anatomical narrow angle, bilateral: Secondary | ICD-10-CM | POA: Diagnosis not present

## 2021-06-30 DIAGNOSIS — H1132 Conjunctival hemorrhage, left eye: Secondary | ICD-10-CM | POA: Diagnosis not present

## 2021-06-30 DIAGNOSIS — H43813 Vitreous degeneration, bilateral: Secondary | ICD-10-CM | POA: Diagnosis not present

## 2021-07-04 IMAGING — MG DIGITAL SCREENING UNILAT RIGHT W/ TOMO W/ CAD
4 series · 4 of 12 positions shown · non-contrast
Comparison: Previous exam(s).

CLINICAL DATA: Screening.

EXAM:
DIGITAL SCREENING UNILATERAL RIGHT MAMMOGRAM WITH CAD AND TOMO

[R MLO synth-2D]
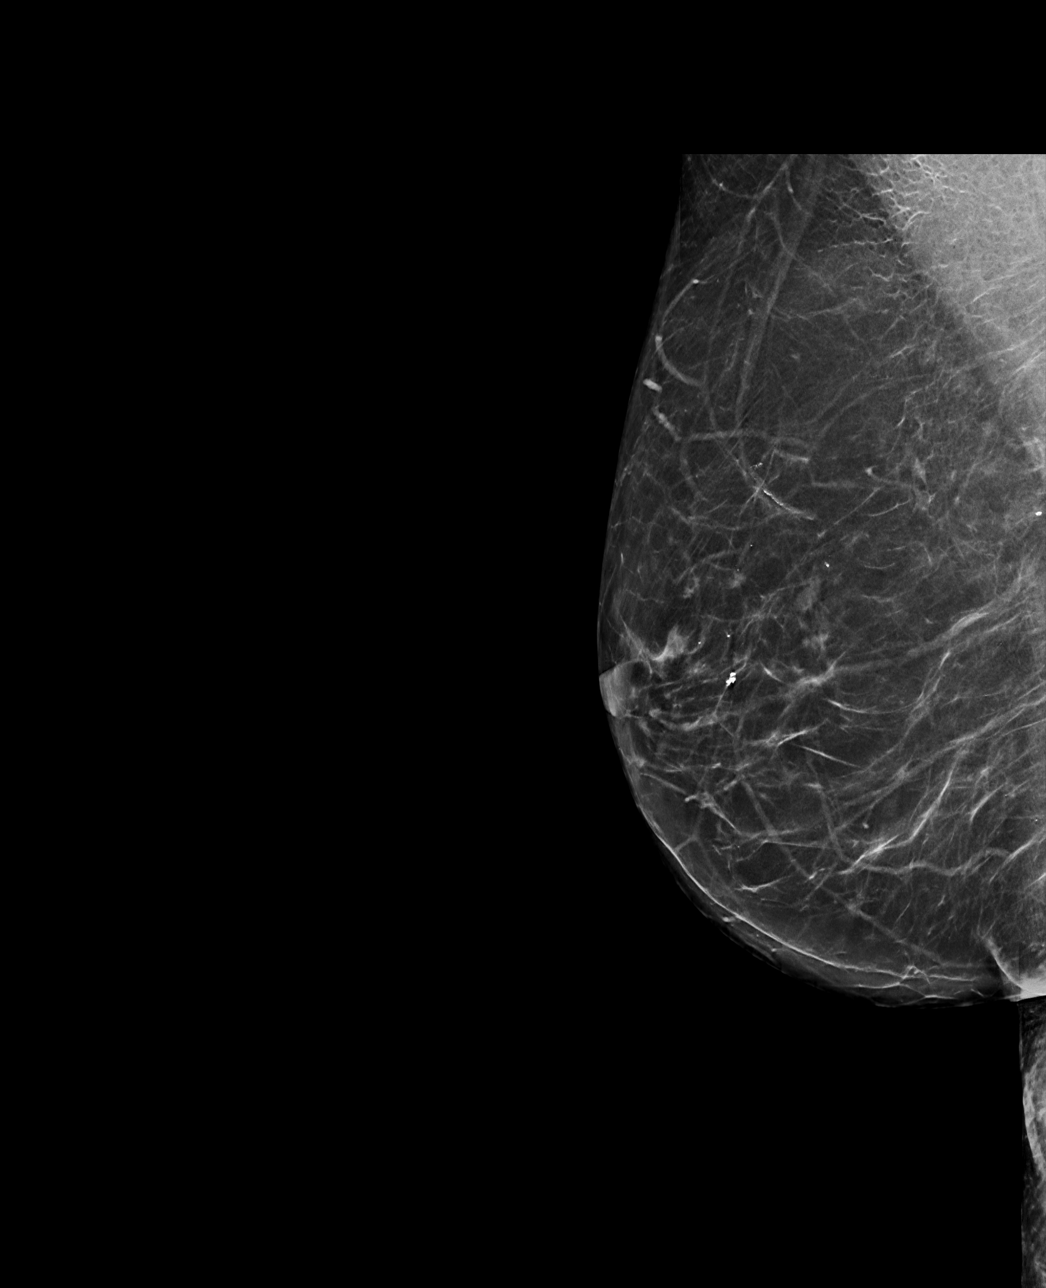

[R CC synth-2D]
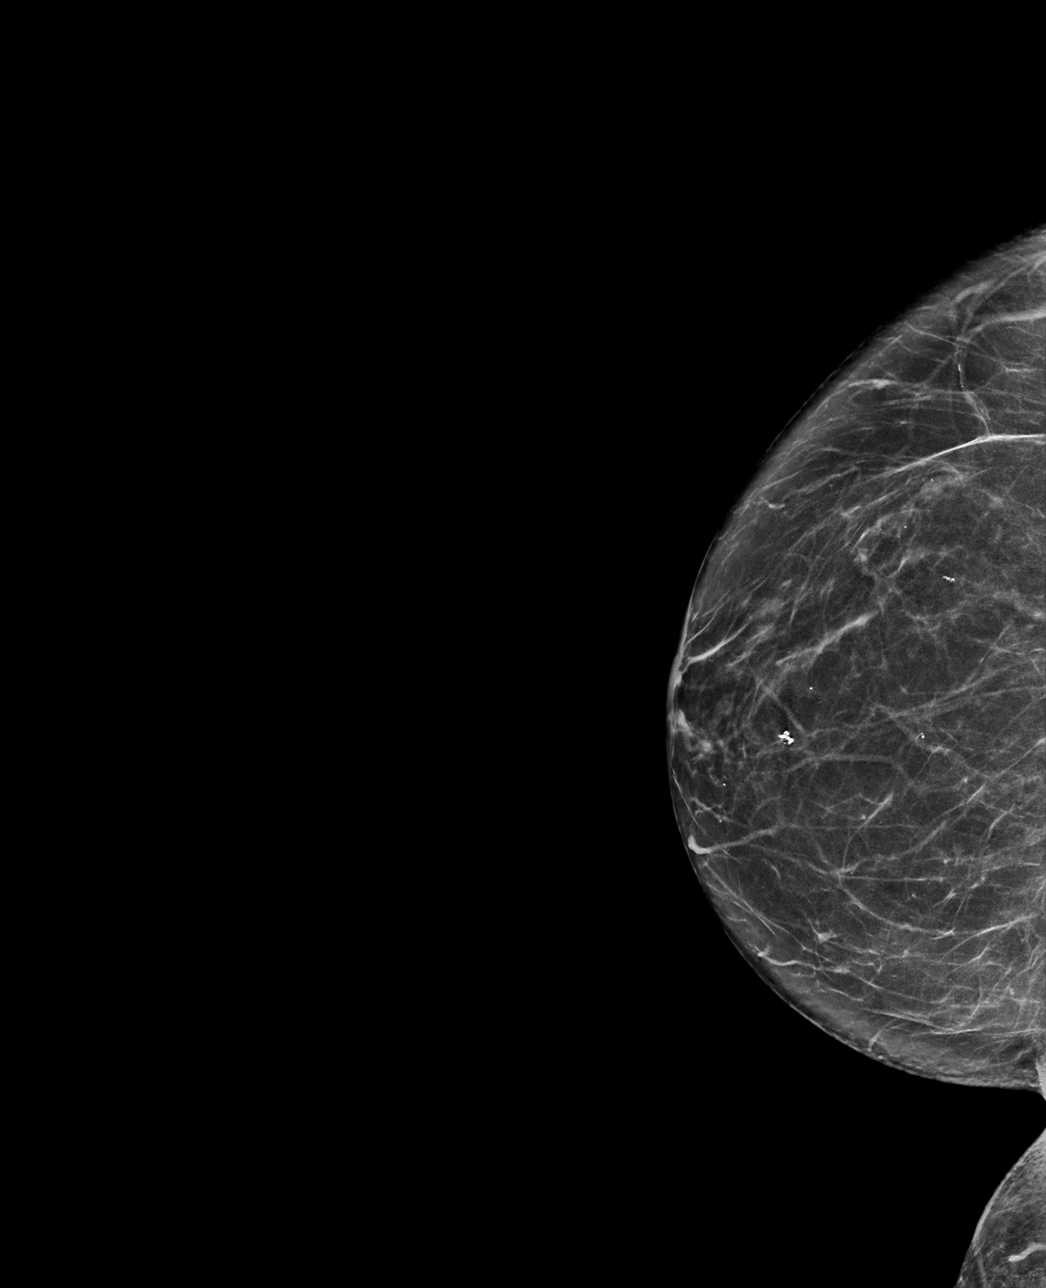

[R CC tomo · tomo slice 33/65.0]
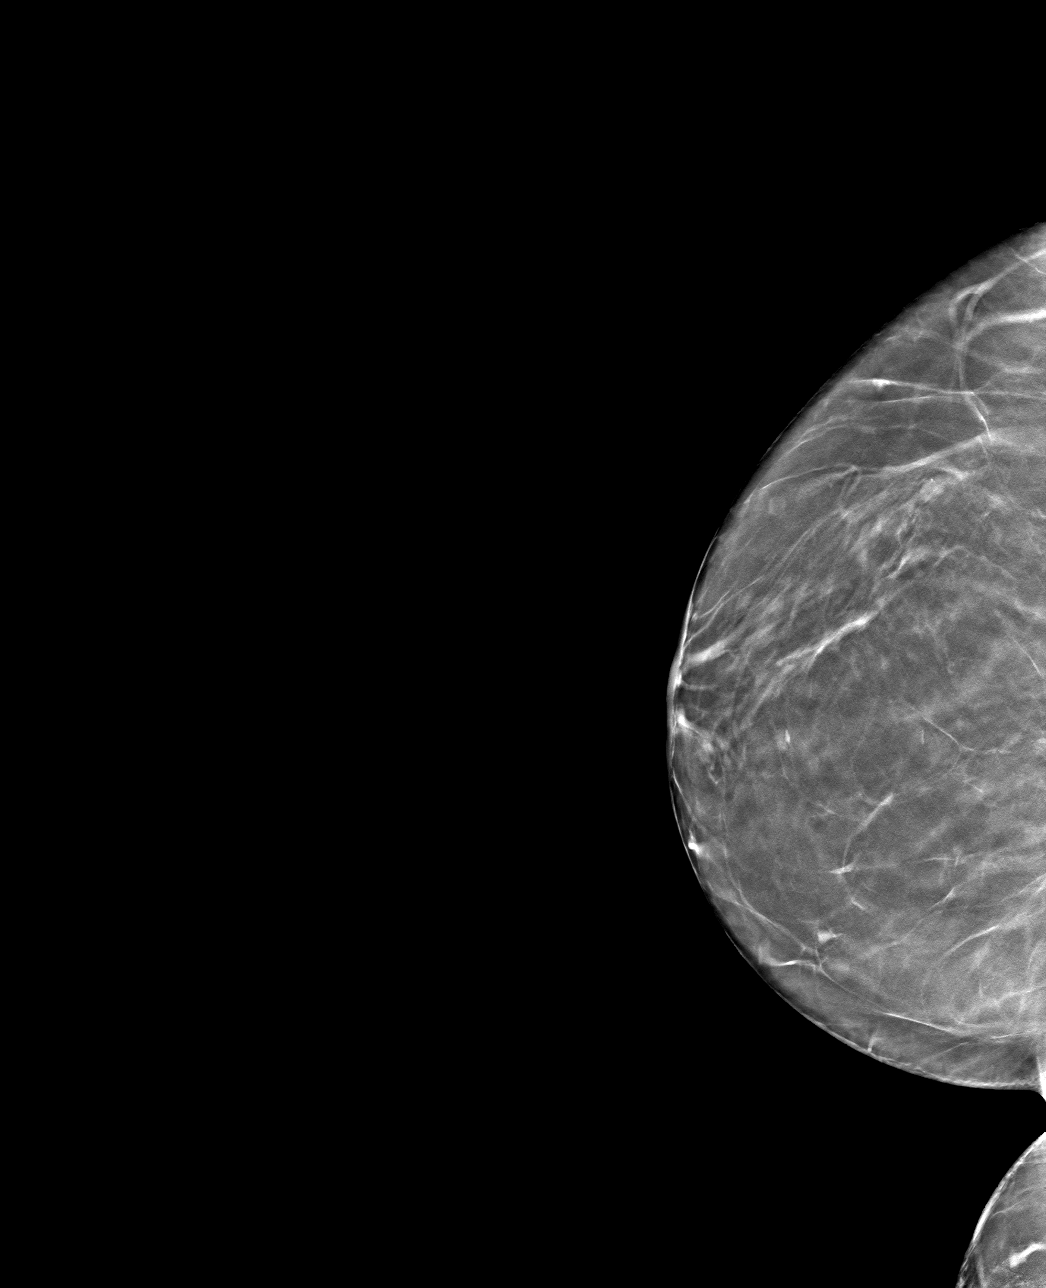

[R MLO tomo · tomo slice 39/77.0]
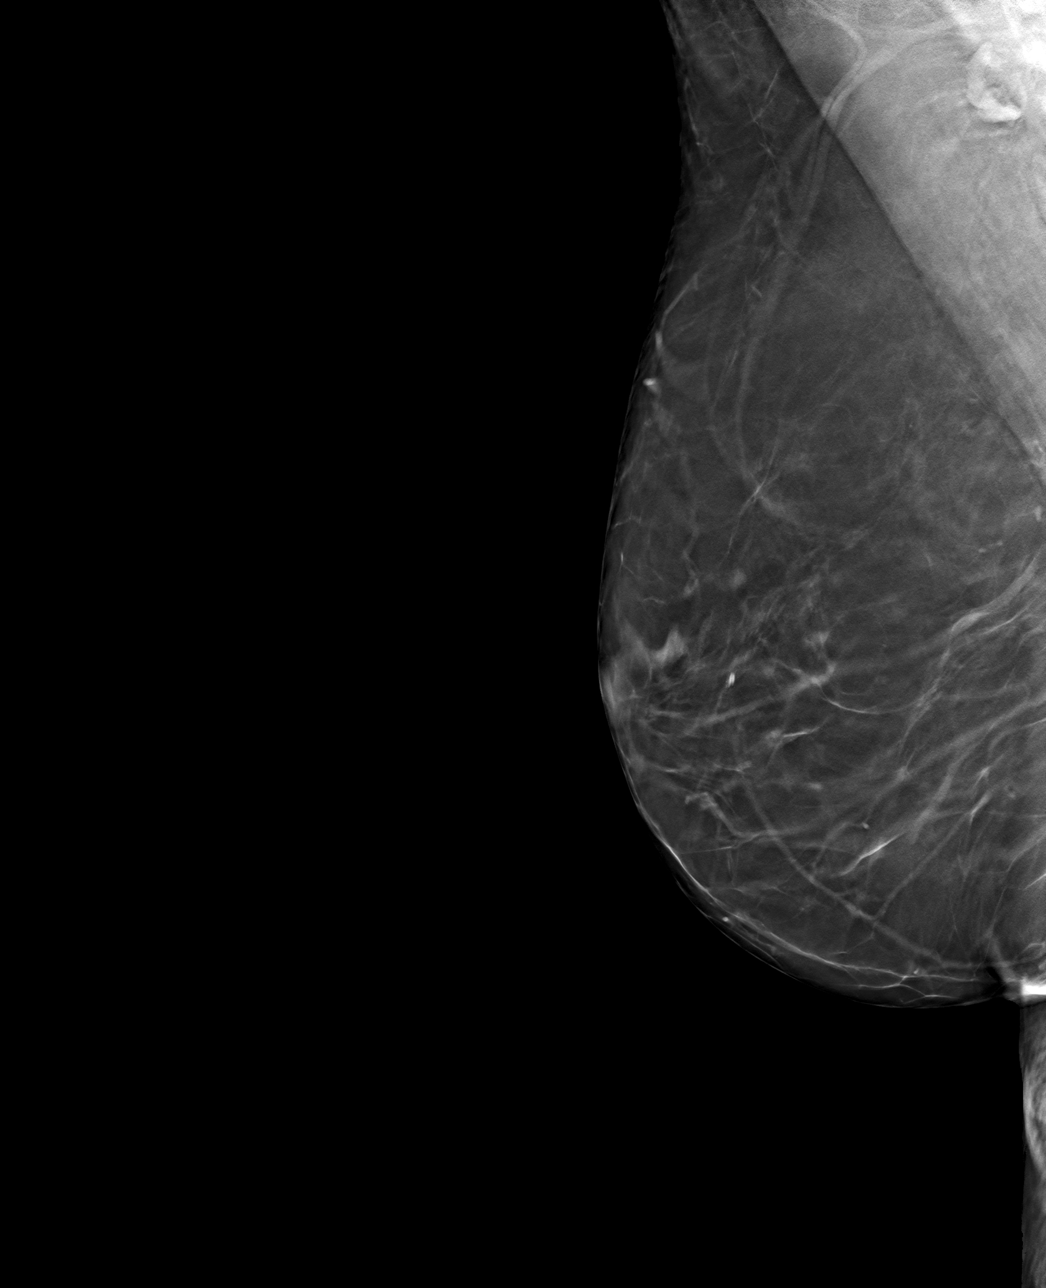

[4 of 12 positions shown; findings below may reference images not displayed]

ACR Breast Density Category b: There are scattered areas of
fibroglandular density.
FINDINGS: In the right breast, a possible mass warrants further evaluation.
The patient is status post left mastectomy. Images were processed
with CAD.
IMPRESSION: Further evaluation is suggested for possible mass in the right
breast.

RECOMMENDATION:
Diagnostic mammogram and possibly ultrasound of the right breast.
(Code:[id number])

The patient will be contacted regarding the findings, and additional
imaging will be scheduled.

BI-RADS CATEGORY  0: Incomplete. Need additional imaging evaluation
and/or prior mammograms for comparison.

## 2021-07-06 ENCOUNTER — Encounter: Payer: Self-pay | Admitting: Physical Therapy

## 2021-07-06 ENCOUNTER — Other Ambulatory Visit: Payer: Self-pay

## 2021-07-06 ENCOUNTER — Ambulatory Visit: Payer: Medicare Other | Attending: Internal Medicine | Admitting: Physical Therapy

## 2021-07-06 DIAGNOSIS — M6281 Muscle weakness (generalized): Secondary | ICD-10-CM | POA: Diagnosis not present

## 2021-07-06 DIAGNOSIS — R293 Abnormal posture: Secondary | ICD-10-CM | POA: Insufficient documentation

## 2021-07-06 DIAGNOSIS — R600 Localized edema: Secondary | ICD-10-CM | POA: Diagnosis not present

## 2021-07-06 DIAGNOSIS — M25512 Pain in left shoulder: Secondary | ICD-10-CM | POA: Diagnosis not present

## 2021-07-06 NOTE — Therapy (Signed)
[facility] ?[address] ?[place], [place], [address] ?Phone: [phone]   Fax:  [phone] ? ?Physical Therapy Evaluation ? ?Patient Details  ?Name: Emma Johns ?MRN: [id number] ?Date of Birth: 09-25-1955 ?Referring Provider (PT): Pahwani, Rinka ? ? ?Encounter Date: 07/06/2021 ? ? PT End of Session - 07/06/21 1642   ? ? Visit Number 1   ? Number of Visits 16   ? Date for PT Re-Evaluation 08/31/21   ? PT Start Time 1500   ? PT Stop Time 1543   ? PT Time Calculation (min) 43 min   ? Activity Tolerance Patient tolerated treatment well   ? Behavior During Therapy [facility] for tasks assessed/performed   ? ?  ?  ? ?  ? ? ?Past Medical History:  ?Diagnosis Date  ? Anxiety   ? Breast cancer ([place])   ? Cancer [facility])   ? Personal history of chemotherapy   ? ? ?Past Surgical History:  ?Procedure Laterality Date  ? AUGMENTATION MAMMAPLASTY    ? reduction right breast 2002  ? MASTECTOMY Left 1994  ? REDUCTION MAMMAPLASTY Right 2002  ? ? ?There were no vitals filed for this visit. ? ? ? Subjective Assessment - 07/06/21 1504   ? ? Subjective Pateint reports L shoulder blade pain. Tends to feel tingling. Starts the day with no pain, but it gets worse during the day. No specific movement causes increased pain. She reports that she had breast cancer 30 years ago and was told at the time that the shoulder may round forward in order to protect that area. She wonders if this is the cause of the pain.   ? Pertinent History L breast cancer, anxiety.   ? Limitations Walking;Standing   ? How long can you walk comfortably? walks about 30 minutes a day for exercise. She swings her arm. Does not have pain while walking, but afterward it is sore.   ? Diagnostic tests X ray shows Very mild acromioclavicular and glenohumeral osteoarthritis.   ? Patient Stated Goals Relief from the pain. correct whatever is causing the pain.   ? Currently in Pain? Yes   ? Pain Score 3    ? Pain Location Back   ? Pain  Orientation Left;Posterior;Upper   ? Pain Descriptors / Indicators Other (Comment);Tingling   Buzzing  ? Pain Type Acute pain   ? Pain Radiating Towards Stays under shoulder blade on L.   ? Pain Onset 1 to 4 weeks ago   ? Pain Frequency Intermittent   ? Aggravating Factors  Gets worse throughout the day   ? Pain Relieving Factors Relieved each morning upon waking.   ? ?  ?  ? ?  ? ? ? ? ? [facility] PT Assessment - 07/06/21 0001   ? ?  ? Assessment  ? Medical Diagnosis L shoulder pain   ? Referring Provider (PT) Pahwani, Rinka   ? Prior Therapy PR for her broken wrist in the past.   ?  ? Balance Screen  ? Has the patient fallen in the past 6 months No   ?  ? Home Environment  ? Living Environment Private residence   ? Living Arrangements Spouse/significant other   ?  ? Prior Function  ? Level of Independence Independent   ? Vocation Retired;Self employed   ? 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   ? Leisure [place]is, read.   ?  ? Cognition  ?  Overall Cognitive Status Within Functional Limits for tasks assessed   ?  ? Sensation  ? Light Touch Appears Intact   ?  ? Posture/Postural Control  ? Posture Comments L shoulderforward, elevated compared to R with scapula mildly elevated and abducted from midline.   ?  ? ROM / Strength  ? AROM / PROM / Strength AROM;Strength   ?  ? AROM  ? Overall AROM Comments Cervical and B shoulder AROM WNL   ?  ? Strength  ? Overall Strength Comments BUE strength is at least 4/5 throughout. L scapula demosntrated mild winging at times iwth movement.   ?  ? Palpation  ? Palpation comment TTP along medial border of L scapula and under scap at the medial border of the L scapular spine.   ? ?  ?  ? ?  ? ? ? ? ? ? ? ? ? ? ? ? ? ?Objective measurements completed on examination: See above findings.  ? ? ? ? ? ? ? ? ? ? ? ? ? ? PT Education - 07/06/21 1641   ? ? Education Details POC for stretch to L shoulder girdle and strengthening for all postural  muscles, emphasizing L scapular stabilizers.   ? Person(s) Educated Patient   ? Methods Explanation   ? Comprehension Verbalized understanding   ? ?  ?  ? ?  ? ? ? PT Short Term Goals - 07/06/21 1649   ? ?  ? PT SHORT TERM GOAL #1  ? Title I with initial HEP   ? Time 4   ? Period Weeks   ? Status New   ? Target Date 08/03/21   ? ?  ?  ? ?  ? ? ? ? PT Long Term Goals - 07/06/21 1649   ? ?  ? PT LONG TERM GOAL #1  ? Title I with  final HEP   ? Time 8   ? Period Weeks   ? Status New   ? Target Date 08/31/21   ?  ? PT LONG TERM GOAL #2  ? Title Patient will maintain L scapular and shoulder position in neutral position throuhgout the day.   ? Baseline L shoulder sloped down, L scapula elevated and abd from midline.   ? Time 8   ? Period Weeks   ? Status New   ? Target Date 08/31/21   ?  ? PT LONG TERM GOAL #3  ? Title patient will report burning sensation in L medial scapular border <3/10 at all times throughout her day.   ? Baseline Up to 8/10   ? Time 8   ? Period Weeks   ? Status New   ? Target Date 08/31/21   ?  ? PT LONG TERM GOAL #4  ? Title Patient will demonstrate improved postural awareness,  in sit and stand, maintaining upright posture with shoulders back, scapulae equal, while perofrming her sewing/quilting activities to decrease pain.   ? Baseline posture flexes as the day progresses.   ? Time 8   ? Period Weeks   ? Status New   ? Target Date 08/31/21   ? ?  ?  ? ?  ? ? ? ? ? ? ? ? ? Plan - 07/06/21 1643   ? ? Clinical Impression Statement Paitent presents with several weeks of increasing burning under L medial scapular border. Her L shoulder sits lower and L scapular position elevated and abd from midline in standing. Scapular position  improved after assessment, possibly due to improve dactivation of stabilizer muscles. She will benefit from PT for pain relief as well and stretching and strengthening to scapular stabilizers to improve her posture, facilitate normalized L scapular stability.   ? Personal  Factors and Comorbidities Comorbidity 1   ? Comorbidities H/O L breast cancer.   ? Examination-Activity Limitations Bathing;Locomotion Level;Reach Overhead;Stand   ? Examination-Participation Restrictions Occupation;Meal Prep;Yard Work;Cleaning   ? Stability/Clinical Decision Making Stable/Uncomplicated   ? Clinical Decision Making Moderate   ? Rehab Potential Good   ? PT Frequency 2x / week   ? PT Duration 8 weeks   ? PT Treatment/Interventions ADLs/Self Care Home Management;Cryotherapy;Electrical Stimulation;Moist Heat;Iontophoresis '4mg'$ /ml Dexamethasone;Traction;Neuromuscular re-education;Manual techniques;Therapeutic exercise;Therapeutic activities;Functional mobility training;Patient/family education   ? PT Next Visit Plan Establish HEP.   ? Consulted and Agree with Plan of Care Patient   ? ?  ?  ? ?  ? ? ?Patient will benefit from skilled therapeutic intervention in order to improve the following deficits and impairments:  Decreased range of motion, Increased muscle spasms, Pain, Decreased mobility, Decreased strength, Postural dysfunction, Improper body mechanics ? ?Visit Diagnosis: ?Acute pain of left shoulder ? ?Abnormal posture ? ?Muscle weakness (generalized) ? ? ? ? ?Problem List ?There are no problems to display for this patient. ? ? ?[name], DPT ?07/06/2021, 4:58 PM ? ?[facility] ?[place] [address] ?[address] ?[place], [place], [address] ?Phone: [phone]   Fax:  [phone] ? ?Name: Emma Johns ?MRN: [id number] ?Date of Birth: 11-08-1955 ? ? ?

## 2021-07-06 NOTE — Patient Instructions (Signed)
Access Code: [email or web address] ?URL: [email or web address] ?Date: 07/06/2021 ?Prepared by: Ethel Rana ? ?Exercises ?Shoulder Extension with Resistance - 1 x daily - 7 x weekly - 2 sets - 10 reps ?Standing Bilateral Low Shoulder Row with Anchored Resistance - 1 x daily - 7 x weekly - 2 sets - 10 reps ?Shoulder External Rotation and Scapular Retraction with Resistance - 1 x daily - 7 x weekly - 2 sets - 10 reps ? ?

## 2021-07-09 ENCOUNTER — Other Ambulatory Visit: Payer: Self-pay

## 2021-07-09 ENCOUNTER — Ambulatory Visit: Payer: Medicare Other | Admitting: Physical Therapy

## 2021-07-09 ENCOUNTER — Encounter: Payer: Self-pay | Admitting: Physical Therapy

## 2021-07-09 DIAGNOSIS — R600 Localized edema: Secondary | ICD-10-CM | POA: Diagnosis not present

## 2021-07-09 DIAGNOSIS — R293 Abnormal posture: Secondary | ICD-10-CM

## 2021-07-09 DIAGNOSIS — M25512 Pain in left shoulder: Secondary | ICD-10-CM | POA: Diagnosis not present

## 2021-07-09 DIAGNOSIS — M6281 Muscle weakness (generalized): Secondary | ICD-10-CM

## 2021-07-09 NOTE — Therapy (Signed)
[facility] ?[place] ?[place], [place], [address] ?Phone: [phone]   Fax:  [phone] ? ?Physical Therapy Treatment ? ?Patient Details  ?Name: Emma Johns ?MRN: [id number] ?Date of Birth: [birth year] ?Referring Provider (PT): Pahwani, Rinka ? ? ?Encounter Date: 07/09/2021 ? ? PT End of Session - 07/09/21 1052   ? ? Visit Number 2   ? Date for PT Re-Evaluation 08/31/21   ? PT Start Time 1015   ? PT Stop Time 1055   ? PT Time Calculation (min) 40 min   ? Activity Tolerance Patient tolerated treatment well   ? Behavior During Therapy [facility] for tasks assessed/performed   ? ?  ?  ? ?  ? ? ?Past Medical History:  ?Diagnosis Date  ? Anxiety   ? Breast cancer ([place])   ? Cancer [facility])   ? Personal history of chemotherapy   ? ? ?Past Surgical History:  ?Procedure Laterality Date  ? AUGMENTATION MAMMAPLASTY    ? reduction right breast 2002  ? MASTECTOMY Left 1994  ? REDUCTION MAMMAPLASTY Right 2002  ? ? ?There were no vitals filed for this visit. ? ? Subjective Assessment - 07/09/21 1016   ? ? Subjective "Im good" She can feel the exercises when doing her HEP.   ? Currently in Pain? No/denies   ? ?  ?  ? ?  ? ? ? ? ? ? ? ? ? ? ? ? ? ? ? ? ? ? ? ? [facility] PT Treatment/Exercise - 07/09/21 0001   ? ?  ? Exercises  ? Exercises Shoulder   ?  ? Shoulder Exercises: Standing  ? Horizontal ABduction Strengthening;Both;Theraband;10 reps   ? Theraband Level (Shoulder Horizontal ABduction) Level 1 (Yellow)   ? External Rotation Strengthening;Both;10 reps;Theraband   ? Theraband Level (Shoulder External Rotation) Level 1 (Yellow)   ? Flexion Strengthening;Both;10 reps   ? Shoulder Flexion Weight (lbs) 2   ? ABduction Strengthening;Both;20 reps   ? Shoulder ABduction Weight (lbs) 1   ? Other Standing Exercises AAROM  3lb WaTE flex, ext IR x10   ?  ? Shoulder Exercises: ROM/Strengthening  ? UBE (Upper Arm Bike) L1.2 x3 min each   ? Other ROM/Strengthening Exercises 5lb chest press  2x10   ? Other ROM/Strengthening Exercises Rows and Lats 15lb 2x10   ?  ? Manual Therapy  ? Manual Therapy Soft tissue mobilization   ? Soft tissue mobilization medial boarder of R scapula   ? ?  ?  ? ?  ? ? ? ? ? ? ? ? ? ? ? ? PT Short Term Goals - 07/09/21 1103   ? ?  ? PT SHORT TERM GOAL #1  ? Title I with initial HEP   ? Status Achieved   ? ?  ?  ? ?  ? ? ? ? PT Long Term Goals - 07/06/21 1649   ? ?  ? PT LONG TERM GOAL #1  ? Title I with  final HEP   ? Time 8   ? Period Weeks   ? Status New   ? Target Date 08/31/21   ?  ? PT LONG TERM GOAL #2  ? Title Patient will maintain L scapular and shoulder position in neutral position throuhgout the day.   ? Baseline L shoulder sloped down, L scapula elevated and abd from midline.   ? Time 8   ? Period Weeks   ? Status New   ?  Target Date 08/31/21   ?  ? PT LONG TERM GOAL #3  ? Title patient will report burning sensation in L medial scapular border <3/10 at all times throughout her day.   ? Baseline Up to 8/10   ? Time 8   ? Period Weeks   ? Status New   ? Target Date 08/31/21   ?  ? PT LONG TERM GOAL #4  ? Title Patient will demonstrate improved postural awareness,  in sit and stand, maintaining upright posture with shoulders back, scapulae equal, while perofrming her sewing/quilting activities to decrease pain.   ? Baseline posture flexes as the day progresses.   ? Time 8   ? Period Weeks   ? Status New   ? Target Date 08/31/21   ? ?  ?  ? ?  ? ? ? ? ? ? ? ? Plan - 07/09/21 1057   ? ? Clinical Impression Statement Pt tolerated an initial progression to TE well evident bu no subjective reports of increase pain. Pt did report that she could feel her L shoulder with all interventions that promoted scapular retraction. Postural cues required with seated rows to prevent trunk leaning. Good ROM with all activities. Pt had good tissues elasticity in the area of discomfort.   ? Personal Factors and Comorbidities Comorbidity 1   ? Examination-Activity Limitations  Bathing;Locomotion Level;Reach Overhead;Stand   ? Examination-Participation Restrictions Occupation;Meal Prep;Yard Work;Cleaning   ? Rehab Potential Good   ? PT Frequency 2x / week   ? PT Duration 8 weeks   ? PT Treatment/Interventions ADLs/Self Care Home Management;Cryotherapy;Electrical Stimulation;Moist Heat;Iontophoresis '4mg'$ /ml Dexamethasone;Traction;Neuromuscular re-education;Manual techniques;Therapeutic exercise;Therapeutic activities;Functional mobility training;Patient/family education   ? PT Next Visit Plan Assess Tx   ? ?  ?  ? ?  ? ? ?Patient will benefit from skilled therapeutic intervention in order to improve the following deficits and impairments:  Decreased range of motion, Increased muscle spasms, Pain, Decreased mobility, Decreased strength, Postural dysfunction, Improper body mechanics ? ?Visit Diagnosis: ?Acute pain of left shoulder ? ?Abnormal posture ? ?Muscle weakness (generalized) ? ? ? ? ?Problem List ?There are no problems to display for this patient. ? ? ?Scot Jun, PTA ?07/09/2021, 11:03 AM ? ?[facility] ?[address] ?[address] ?[place], [place], [address] ?Phone: [phone]   Fax:  [phone] ? ?Name: Emma Johns ?MRN: [id number] ?Date of Birth: [birth year] ? ? ? ?

## 2021-07-20 ENCOUNTER — Ambulatory Visit: Payer: Medicare Other | Admitting: Physical Therapy

## 2021-07-21 ENCOUNTER — Ambulatory Visit: Payer: Medicare Other | Admitting: Physical Therapy

## 2021-07-21 ENCOUNTER — Other Ambulatory Visit: Payer: Self-pay

## 2021-07-21 DIAGNOSIS — R293 Abnormal posture: Secondary | ICD-10-CM | POA: Diagnosis not present

## 2021-07-21 DIAGNOSIS — M25512 Pain in left shoulder: Secondary | ICD-10-CM

## 2021-07-21 DIAGNOSIS — R600 Localized edema: Secondary | ICD-10-CM | POA: Diagnosis not present

## 2021-07-21 DIAGNOSIS — M6281 Muscle weakness (generalized): Secondary | ICD-10-CM | POA: Diagnosis not present

## 2021-07-21 NOTE — Therapy (Signed)
[facility] ?[place] ?[address] ?[place], [place], [address] ?Phone: [phone]   Fax:  [phone] ? ?Physical Therapy Treatment ? ?Patient Details  ?Name: Emma Johns ?MRN: [id number] ?Date of Birth: November 27, 1955 ?Referring Provider (PT): Pahwani, Rinka ? ? ?Encounter Date: 07/21/2021 ? ? PT End of Session - 07/21/21 1608   ? ? Visit Number 3   ? Number of Visits 16   ? Date for PT Re-Evaluation 08/31/21   ? PT Start Time 1530   ? PT Stop Time 1610   ? PT Time Calculation (min) 40 min   ? ?  ?  ? ?  ? ? ?Past Medical History:  ?Diagnosis Date  ? Anxiety   ? Breast cancer ([place])   ? Cancer [facility])   ? Personal history of chemotherapy   ? ? ?Past Surgical History:  ?Procedure Laterality Date  ? AUGMENTATION MAMMAPLASTY    ? reduction right breast 2002  ? MASTECTOMY Left 1994  ? REDUCTION MAMMAPLASTY Right 2002  ? ? ?There were no vitals filed for this visit. ? ? Subjective Assessment - 07/21/21 1534   ? ? Subjective ex are really helping. no pain but still tender with palpation   ? Currently in Pain? No/denies   ? ?  ?  ? ?  ? ? ? ? ? ? ? ? ? ? ? ? ? ? ? ? ? ? ? ? [facility] Adult PT Treatment/Exercise - 07/21/21 0001   ? ?  ? Shoulder Exercises: Standing  ? Other Standing Exercises cable pulleys shld ext 5#  and row 10# 2 sets 10   cane ex 3# shld ext and IR 15 each  ? Other Standing Exercises ball vs wal 3 x each way. red tband 3 way stab on wall 10 each UE   ?  ? Shoulder Exercises: ROM/Strengthening  ? UBE (Upper Arm Bike) L 2 3 min fwd/3 min back   ? Lat Pull 20 reps   15#  ?  ? Manual Therapy  ? Manual Therapy Soft tissue mobilization;Taping   ? Manual therapy comments KT tape to localized TP on lower Left medial border   ? Soft tissue mobilization medial boarder of left scapula   ? [place]   ? ?  ?  ? ?  ? ? ? ? ? ? ? ? ? ? ? ? PT Short Term Goals - 07/21/21 1608   ? ?  ? PT SHORT TERM GOAL #1  ? Title I with initial HEP   ? Status Achieved   ? ?  ?  ? ?   ? ? ? ? PT Long Term Goals - 07/21/21 1608   ? ?  ? PT LONG TERM GOAL #1  ? Title I with  final HEP   ? Status On-going   ?  ? PT LONG TERM GOAL #2  ? Title Patient will maintain L scapular and shoulder position in neutral position throuhgout the day.   ? Status On-going   ?  ? PT LONG TERM GOAL #3  ? Title patient will report burning sensation in L medial scapular border <3/10 at all times throughout her day.   ? Status Partially Met   ?  ? PT LONG TERM GOAL #4  ? Title Patient will demonstrate improved postural awareness,  in sit and stand, maintaining upright posture with shoulders back, scapulae equal, while perofrming her sewing/quilting activities to decrease pain.   ?  Status On-going   ? ?  ?  ? ?  ? ? ? ? ? ? ? ? Plan - 07/21/21 1608   ? ? Clinical Impression Statement pt states ex are really helping and she feel sbetter, less fatigue as day goes on. pt does have TP distal left medial scapula border with hurts and burns with STW- trial of KT to see if helps symptoms. progressing with goals   ? PT Treatment/Interventions ADLs/Self Care Home Management;Cryotherapy;Electrical Stimulation;Moist Heat;Iontophoresis 37m/ml Dexamethasone;Traction;Neuromuscular re-education;Manual techniques;Therapeutic exercise;Therapeutic activities;Functional mobility training;Patient/family education   ? PT Next Visit Plan assess and progress. psotural ex and educ on BM and posture for home actvities   ? ?  ?  ? ?  ? ? ?Patient will benefit from skilled therapeutic intervention in order to improve the following deficits and impairments:  Decreased range of motion, Increased muscle spasms, Pain, Decreased mobility, Decreased strength, Postural dysfunction, Improper body mechanics ? ?Visit Diagnosis: ?Acute pain of left shoulder ? ?Abnormal posture ? ?Muscle weakness (generalized) ? ? ? ? ?Problem List ?There are no problems to display for this patient. ? ? ?PAYSEUR,ANGIE, PTA ?07/21/2021, 4:11 PM ? ?[facility] ?[address] ?[place] [address]?Phone: [phone]  Fax:  [phone]? ?Name: Emma Johns?MRN: [id number]?Date of Birth: 3July 18, 1957? ? ? ?

## 2021-07-22 ENCOUNTER — Ambulatory Visit: Payer: Medicare Other | Admitting: Physical Therapy

## 2021-07-27 ENCOUNTER — Other Ambulatory Visit: Payer: Self-pay

## 2021-07-27 ENCOUNTER — Ambulatory Visit: Payer: Medicare Other | Admitting: Physical Therapy

## 2021-07-27 DIAGNOSIS — R293 Abnormal posture: Secondary | ICD-10-CM

## 2021-07-27 DIAGNOSIS — M6281 Muscle weakness (generalized): Secondary | ICD-10-CM

## 2021-07-27 DIAGNOSIS — M25512 Pain in left shoulder: Secondary | ICD-10-CM

## 2021-07-27 DIAGNOSIS — R600 Localized edema: Secondary | ICD-10-CM | POA: Diagnosis not present

## 2021-07-27 NOTE — Therapy (Signed)
[place] ?[place] ?[address] ?[place], [place], [address] ?Phone: [phone]   Fax:  [phone] ? ?Physical Therapy Treatment ? ?Patient Details  ?Name: Emma Johns ?MRN: [id number] ?Date of Birth: [birth year] ?Referring Provider (PT): Pahwani, Rinka ? ? ?Encounter Date: 07/27/2021 ? ? PT End of Session - 07/27/21 1516   ? ? Visit Number 4   ? Number of Visits 16   ? Date for PT Re-Evaluation 08/31/21   ? PT Start Time 0233   ? PT Stop Time 4356   ? PT Time Calculation (min) 40 min   ? ?  ?  ? ?  ? ? ?Past Medical History:  ?Diagnosis Date  ? Anxiety   ? Breast cancer ([place])   ? Cancer [facility])   ? Personal history of chemotherapy   ? ? ?Past Surgical History:  ?Procedure Laterality Date  ? AUGMENTATION MAMMAPLASTY    ? reduction right breast 2002  ? MASTECTOMY Left 1994  ? REDUCTION MAMMAPLASTY Right 2002  ? ? ?There were no vitals filed for this visit. ? ? Subjective Assessment - 07/27/21 1521   ? ? Subjective STates that she doesn't have much pain, feels the exercises are helping. Reports she couldn't tell a difference with th tape.   ? Currently in Pain? No/denies   ? ?  ?  ? ?  ? ? ? ? ? [facility] PT Assessment - 07/27/21 0001   ? ?  ? Assessment  ? Medical Diagnosis L shoulder pain   ? ?  ?  ? ?  ? ? ? ? ? ? ? ? ? ? ? ? ? ? ? ? [facility] Adult PT Treatment/Exercise - 07/27/21 0001   ? ?  ? Shoulder Exercises: Seated  ? Other Seated Exercises UBE 2 min fwd and 2 minutes back   ? Other Seated Exercises chin tuck x15 5" holds PT hand over chin assist   ?  ? Shoulder Exercises: Standing  ? Other Standing Exercises cable pulleys shld ext 15#  and row 15# 1 sets 15   ? Other Standing Exercises shoulder stars at wall 3x3 each position red theraband, wall walks red band at mid forearm 2x5 laps bilaterally, shoulder ER at wall x15 5" holds, wall angels with back against the wall 2x15   ?  ? Manual Therapy  ? Manual Therapy Soft tissue mobilization;Joint mobilization   ? Joint  Mobilization to left scapula in all directions   ? Soft tissue mobilization medial boarder of left scapula and UT   ? ?  ?  ? ?  ? ? ? ? ? ? ? ? ? ? PT Education - 07/27/21 1557   ? ? Education Details on importance of posture and rationale for chin tuck.   ? Person(s) Educated Patient   ? Methods Explanation   ? Comprehension Verbalized understanding   ? ?  ?  ? ?  ? ? ? PT Short Term Goals - 07/21/21 1608   ? ?  ? PT SHORT TERM GOAL #1  ? Title I with initial HEP   ? Status Achieved   ? ?  ?  ? ?  ? ? ? ? PT Long Term Goals - 07/21/21 1608   ? ?  ? PT LONG TERM GOAL #1  ? Title I with  final HEP   ? Status On-going   ?  ? PT LONG TERM GOAL #2  ? Title Patient will maintain  L scapular and shoulder position in neutral position throuhgout the day.   ? Status On-going   ?  ? PT LONG TERM GOAL #3  ? Title patient will report burning sensation in L medial scapular border <3/10 at all times throughout her day.   ? Status Partially Met   ?  ? PT LONG TERM GOAL #4  ? Title Patient will demonstrate improved postural awareness,  in sit and stand, maintaining upright posture with shoulders back, scapulae equal, while perofrming her sewing/quilting activities to decrease pain.   ? Status On-going   ? ?  ?  ? ?  ? ? ? ? ? ? ? ? Plan - 07/27/21 1528   ? ? Clinical Impression Statement Added shoulder stabilization exercise with band at wall and this was tolerated well. Increased fatigue noted in left arm compared to right which is to be expected. Added band exercises wall series to HEP, patient reported she already had a band at home so none was provided for her.  No pain noted during session, just fatigue. Mild uncertainty with wall angels but overall movement and confidence in movement improved with repetition.   ? PT Treatment/Interventions ADLs/Self Care Home Management;Cryotherapy;Electrical Stimulation;Moist Heat;Iontophoresis 30m/ml Dexamethasone;Traction;Neuromuscular re-education;Manual techniques;Therapeutic  exercise;Therapeutic activities;Functional mobility training;Patient/family education   ? PT Next Visit Plan assess and progress. psotural ex and educ on BM and posture for home actvities   ? ?  ?  ? ?  ? ? ?Patient will benefit from skilled therapeutic intervention in order to improve the following deficits and impairments:  Decreased range of motion, Increased muscle spasms, Pain, Decreased mobility, Decreased strength, Postural dysfunction, Improper body mechanics ? ?Visit Diagnosis: ?Acute pain of left shoulder ? ?Abnormal posture ? ?Muscle weakness (generalized) ? ? ? ? ?Problem List ?There are no problems to display for this patient. ? ? ?3:57 PM, 07/27/21 ?MJerene Pitch DPT ?Physical Therapy with [place] ?[facility] ?[phone] ? ? ?[place] ?[address] ?[place] [address]?Phone: [phone]  Fax:  [phone]? ?Name: Emma Johns?MRN: [id number]?Date of Birth: [birth year]? ? ? ?

## 2021-07-30 ENCOUNTER — Ambulatory Visit: Payer: Medicare Other | Admitting: Physical Therapy

## 2021-07-30 ENCOUNTER — Encounter: Payer: Self-pay | Admitting: Physical Therapy

## 2021-07-30 DIAGNOSIS — M25512 Pain in left shoulder: Secondary | ICD-10-CM | POA: Diagnosis not present

## 2021-07-30 DIAGNOSIS — R600 Localized edema: Secondary | ICD-10-CM | POA: Diagnosis not present

## 2021-07-30 DIAGNOSIS — M6281 Muscle weakness (generalized): Secondary | ICD-10-CM

## 2021-07-30 DIAGNOSIS — R293 Abnormal posture: Secondary | ICD-10-CM | POA: Diagnosis not present

## 2021-07-30 NOTE — Therapy (Signed)
?[facility] ?[address]. ?[place], [place], [address] ?Phone: [phone]   Fax:  [phone] ? ?Physical Therapy Treatment ? ?Patient Details  ?Name: Emma Johns ?MRN: [id number] ?Date of Birth: 11/23/1955 ?Referring Provider (PT): Pahwani, Rinka ? ? ?Encounter Date: 07/30/2021 ? ? PT End of Session - 07/30/21 1135   ? ? Visit Number 5   ? Date for PT Re-Evaluation 08/31/21   ? PT Start Time 1102   ? PT Stop Time 2951   ? PT Time Calculation (min) 40 min   ? Activity Tolerance Patient tolerated treatment well   ? Behavior During Therapy [facility] for tasks assessed/performed   ? ?  ?  ? ?  ? ? ?Past Medical History:  ?Diagnosis Date  ? Anxiety   ? Breast cancer ([place])   ? Cancer [facility])   ? Personal history of chemotherapy   ? ? ?Past Surgical History:  ?Procedure Laterality Date  ? AUGMENTATION MAMMAPLASTY    ? reduction right breast 2002  ? MASTECTOMY Left 1994  ? REDUCTION MAMMAPLASTY Right 2002  ? ? ?There were no vitals filed for this visit. ? ? Subjective Assessment - 07/30/21 1105   ? ? Subjective Feels better overall, but did have some pain last night.   ? Pertinent History L breast cancer, anxiety.   ? Currently in Pain? No/denies   ? ?  ?  ? ?  ? ? ? ? ? ? ? ? ? ? ? ? ? ? ? ? ? ? ? ? [place] Adult PT Treatment/Exercise - 07/30/21 0001   ? ?  ? Shoulder Exercises: ROM/Strengthening  ? Nustep U and LE, L5 x 6 minutes.   ?  ? Shoulder Exercises: Power Tower  ? Extension 20 reps   ? Extension Limitations 15#   ? Row 25 reps   ? Row Limitations 15#   ? Other Power Engineer, water Lat pull downs 2 x 10 reps 20#   ?  ? Modalities  ? Modalities Vasopneumatic   ?  ? Vasopneumatic  ? Number Minutes Vasopneumatic  10 minutes   ? Vasopnuematic Location  Shoulder   ? Vasopneumatic Pressure Low   ? Vasopneumatic Temperature  34   ?  ? Manual Therapy  ? Manual Therapy Soft tissue mobilization;Passive ROM   ? Soft tissue mobilization medial boarder of left scapula and UT   ?  Passive ROM scapular abd L   ? ?  ?  ? ?  ? ? ? ? ? ? ? ? ? ? ? ? PT Short Term Goals - 07/21/21 1608   ? ?  ? PT SHORT TERM GOAL #1  ? Title I with initial HEP   ? Status Achieved   ? ?  ?  ? ?  ? ? ? ? PT Long Term Goals - 07/30/21 1136   ? ?  ? PT LONG TERM GOAL #1  ? Title I with  final HEP   ? Status On-going   ?  ? PT LONG TERM GOAL #2  ? Title Patient will maintain L scapular and shoulder position in neutral position throuhgout the day.   ? Status On-going   ?  ? PT LONG TERM GOAL #3  ? Title patient will report burning sensation in L medial scapular border <3/10 at all times throughout her day.   ?  ? PT LONG TERM GOAL #4  ? Title Patient will demonstrate improved postural awareness,  in sit and stand, maintaining upright posture with shoulders back, scapulae equal, while perofrming her sewing/quilting activities to decrease pain.   ? Status On-going   ? ?  ?  ? ?  ? ? ? ? ? ? ? ? Plan - 07/30/21 1128   ? ? Clinical Impression Statement Patient reports continued improvement, but she still feels the tightness and pain at times. Provided STM and stretch to L scapular muscles followed by strengthening with weight machine. She tolerated all activities well. Finished with Vaso.   ? Stability/Clinical Decision Making Stable/Uncomplicated   ? Clinical Decision Making Moderate   ? Rehab Potential Good   ? PT Frequency 2x / week   ? PT Duration 6 weeks   ? PT Treatment/Interventions ADLs/Self Care Home Management;Cryotherapy;Electrical Stimulation;Moist Heat;Iontophoresis '4mg'$ /ml Dexamethasone;Traction;Neuromuscular re-education;Manual techniques;Therapeutic exercise;Therapeutic activities;Functional mobility training;Patient/family education   ? PT Next Visit Plan assess and progress. psotural ex and educ on BM and posture for home actvities   ? PT Home Exercise Plan [email or web address]   ? Consulted and Agree with Plan of Care Patient   ? ?  ?  ? ?  ? ? ?Patient will benefit from skilled therapeutic intervention in order to  improve the following deficits and impairments:  Decreased range of motion, Increased muscle spasms, Pain, Decreased mobility, Decreased strength, Postural dysfunction, Improper body mechanics ? ?Visit Diagnosis: ?Acute pain of left shoulder ? ?Abnormal posture ? ?Muscle weakness (generalized) ? ?Localized edema ? ? ? ? ?Problem List ?There are no problems to display for this patient. ? ? ?Emma Johns, DPT ?07/30/2021, 11:37 AM ? ?[name] ?[place] ?[address] ?[place], [place], [address] ?Phone: [phone]   Fax:  [phone] ? ?Name: Emma Johns ?MRN: [id number] ?Date of Birth: Aug 14, 1955 ? ? ? ?

## 2021-08-03 ENCOUNTER — Encounter: Payer: Self-pay | Admitting: Physical Therapy

## 2021-08-03 ENCOUNTER — Ambulatory Visit: Payer: Medicare Other | Attending: Internal Medicine | Admitting: Physical Therapy

## 2021-08-03 DIAGNOSIS — M6281 Muscle weakness (generalized): Secondary | ICD-10-CM | POA: Diagnosis not present

## 2021-08-03 DIAGNOSIS — M25512 Pain in left shoulder: Secondary | ICD-10-CM

## 2021-08-03 DIAGNOSIS — R293 Abnormal posture: Secondary | ICD-10-CM | POA: Diagnosis not present

## 2021-08-03 DIAGNOSIS — R600 Localized edema: Secondary | ICD-10-CM | POA: Diagnosis not present

## 2021-08-03 NOTE — Therapy (Signed)
[facility] ?[place] ?[address]. ?[place], [place], [address] ?Phone: [phone]   Fax:  [phone] ? ?Physical Therapy Treatment ? ?Patient Details  ?Name: Emma Johns ?MRN: [id number] ?Date of Birth: Nov 08, 1955 ?Referring Provider (PT): Pahwani, Rinka ? ? ?Encounter Date: 08/03/2021 ? ? PT End of Session - 08/03/21 1536   ? ? Visit Number 6   ? Date for PT Re-Evaluation 08/31/21   ? PT Start Time 1500   ? PT Stop Time 1542   ? PT Time Calculation (min) 42 min   ? Activity Tolerance Patient tolerated treatment well   ? Behavior During Therapy [facility] for tasks assessed/performed   ? ?  ?  ? ?  ? ? ?Past Medical History:  ?Diagnosis Date  ? Anxiety   ? Breast cancer ([place])   ? Cancer [facility])   ? Personal history of chemotherapy   ? ? ?Past Surgical History:  ?Procedure Laterality Date  ? AUGMENTATION MAMMAPLASTY    ? reduction right breast 2002  ? MASTECTOMY Left 1994  ? REDUCTION MAMMAPLASTY Right 2002  ? ? ?There were no vitals filed for this visit. ? ? Subjective Assessment - 08/03/21 1500   ? ? Subjective Pateitn reports that the doorway stretch is very effective. She has to be cautious not to stretch too hard.   ? Pertinent History L breast cancer, anxiety.   ? Currently in Pain? No/denies   ? ?  ?  ? ?  ? ? ? ? ? ? ? ? ? ? ? ? ? ? ? ? ? ? ? ? [place] Adult PT Treatment/Exercise - 08/03/21 0001   ? ?  ? Shoulder Exercises: Prone  ? Other Prone Exercises Quadruped alternating reach with each hand, each leg, then birdog, 5 reps each..   ?  ? Shoulder Exercises: Standing  ? Other Standing Exercises Shoulder extension using rod an 4# resistance.   ? Other Standing Exercises Cable pulls, 15# ext and rows, 2 x 10 reps, B.   ?  ? Shoulder Exercises: ROM/Strengthening  ? UBE (Upper Arm Bike) L 2 3 min fwd/3 min back   ?  ? Shoulder Exercises: Power Tower  ? Other Power Midwife press iwth 10#, 2 x 10 each way.   ?  ? Vasopneumatic  ? Number Minutes Vasopneumatic  12  minutes   ? Vasopnuematic Location  Shoulder   ? Vasopneumatic Pressure Low   ? Vasopneumatic Temperature  34   ?  ? Manual Therapy  ? Manual Therapy Soft tissue mobilization;Passive ROM   ? Soft tissue mobilization medial boarder of left scapula and UT   ? Passive ROM scapular abd L   ? ?  ?  ? ?  ? ? ? ? ? ? ? ? ? ? ? ? PT Short Term Goals - 07/21/21 1608   ? ?  ? PT SHORT TERM GOAL #1  ? Title I with initial HEP   ? Status Achieved   ? ?  ?  ? ?  ? ? ? ? PT Long Term Goals - 08/03/21 1514   ? ?  ? PT LONG TERM GOAL #1  ? Title I with  final HEP   ? Status On-going   ?  ? PT LONG TERM GOAL #2  ? Title Patient will maintain L scapular and shoulder position in neutral position throuhgout the day.   ? Time 4   ? Period Weeks   ?  Status On-going   ?  ? PT LONG TERM GOAL #3  ? Title patient will report burning sensation in L medial scapular border <3/10 at all times throughout her day.   ? Baseline 5/10 max, less frequent.   ? Time 4   ? Period Weeks   ? Target Date 08/31/21   ?  ? PT LONG TERM GOAL #4  ? Title Patient will demonstrate improved postural awareness,  in sit and stand, maintaining upright posture with shoulders back, scapulae equal, while perofrming her sewing/quilting activities to decrease pain.   ? Time 4   ? Period Weeks   ? Status On-going   ? Target Date 08/31/21   ? ?  ?  ? ?  ? ? ? ? ? ? ? ? Plan - 08/03/21 1516   ? ? Clinical Impression Statement Patient reports her L medial scapular pain/burning has improved to a max of 5/10, with decreased frequency noted as well. Progressed treatment with stregnthening for upper trunk/back for stability and postural control.   ? Stability/Clinical Decision Making Stable/Uncomplicated   ? Clinical Decision Making Moderate   ? Rehab Potential Good   ? PT Frequency 2x / week   ? PT Duration 4 weeks   ? PT Treatment/Interventions ADLs/Self Care Home Management;Cryotherapy;Electrical Stimulation;Moist Heat;Iontophoresis '4mg'$ /ml Dexamethasone;Traction;Neuromuscular  re-education;Manual techniques;Therapeutic exercise;Therapeutic activities;Functional mobility training;Patient/family education   ? PT Next Visit Plan progress with strengthening and potural control/stability.   ? PT Home Exercise Plan [id number]   ? Consulted and Agree with Plan of Care Patient   ? ?  ?  ? ?  ? ? ?Patient will benefit from skilled therapeutic intervention in order to improve the following deficits and impairments:  Decreased range of motion, Increased muscle spasms, Pain, Decreased mobility, Decreased strength, Postural dysfunction, Improper body mechanics ? ?Visit Diagnosis: ?Acute pain of left shoulder ? ?Abnormal posture ? ?Muscle weakness (generalized) ? ?Localized edema ? ? ? ? ?Problem List ?There are no problems to display for this patient. ? ? ?[name], DPT ?08/03/2021, 3:37 PM ? ?[place] ?[place] ?[address] ?[place], [place], [address] ?Phone: [phone]   Fax:  [phone] ? ?Name: HALLI EQUIHUA ?MRN: [id number] ?Date of Birth: [birth year] ? ? ? ?

## 2021-08-05 ENCOUNTER — Ambulatory Visit: Payer: Medicare Other | Admitting: Physical Therapy

## 2021-08-17 ENCOUNTER — Other Ambulatory Visit: Payer: Self-pay | Admitting: Family Medicine

## 2021-08-17 DIAGNOSIS — Z1231 Encounter for screening mammogram for malignant neoplasm of breast: Secondary | ICD-10-CM

## 2021-08-17 DIAGNOSIS — K219 Gastro-esophageal reflux disease without esophagitis: Secondary | ICD-10-CM | POA: Diagnosis not present

## 2021-08-17 DIAGNOSIS — K449 Diaphragmatic hernia without obstruction or gangrene: Secondary | ICD-10-CM | POA: Diagnosis not present

## 2021-09-07 ENCOUNTER — Ambulatory Visit: Payer: Medicare Other

## 2021-09-10 ENCOUNTER — Ambulatory Visit
Admission: RE | Admit: 2021-09-10 | Discharge: 2021-09-10 | Disposition: A | Discharge status: Home / Self Care | Payer: Medicare Other | Source: Ambulatory Visit | Attending: Family Medicine | Admitting: Family Medicine

## 2021-09-10 DIAGNOSIS — Z1231 Encounter for screening mammogram for malignant neoplasm of breast: Secondary | ICD-10-CM

## 2021-10-05 DIAGNOSIS — K219 Gastro-esophageal reflux disease without esophagitis: Secondary | ICD-10-CM | POA: Diagnosis not present

## 2021-10-05 DIAGNOSIS — I350 Nonrheumatic aortic (valve) stenosis: Secondary | ICD-10-CM | POA: Diagnosis not present

## 2021-10-05 DIAGNOSIS — E7800 Pure hypercholesterolemia, unspecified: Secondary | ICD-10-CM | POA: Diagnosis not present

## 2021-10-05 DIAGNOSIS — I10 Essential (primary) hypertension: Secondary | ICD-10-CM | POA: Diagnosis not present

## 2021-10-12 DIAGNOSIS — L814 Other melanin hyperpigmentation: Secondary | ICD-10-CM | POA: Diagnosis not present

## 2021-10-12 DIAGNOSIS — L409 Psoriasis, unspecified: Secondary | ICD-10-CM | POA: Diagnosis not present

## 2022-04-06 DIAGNOSIS — Z130 Encounter for screening for diseases of the blood and blood-forming organs and certain disorders involving the immune mechanism: Secondary | ICD-10-CM | POA: Diagnosis not present

## 2022-04-06 DIAGNOSIS — K589 Irritable bowel syndrome without diarrhea: Secondary | ICD-10-CM | POA: Diagnosis not present

## 2022-04-06 DIAGNOSIS — E7800 Pure hypercholesterolemia, unspecified: Secondary | ICD-10-CM | POA: Diagnosis not present

## 2022-04-06 DIAGNOSIS — H905 Unspecified sensorineural hearing loss: Secondary | ICD-10-CM | POA: Diagnosis not present

## 2022-04-06 DIAGNOSIS — M8588 Other specified disorders of bone density and structure, other site: Secondary | ICD-10-CM | POA: Diagnosis not present

## 2022-04-06 DIAGNOSIS — I350 Nonrheumatic aortic (valve) stenosis: Secondary | ICD-10-CM | POA: Diagnosis not present

## 2022-04-06 DIAGNOSIS — I10 Essential (primary) hypertension: Secondary | ICD-10-CM | POA: Diagnosis not present

## 2022-04-06 DIAGNOSIS — G2581 Restless legs syndrome: Secondary | ICD-10-CM | POA: Diagnosis not present

## 2022-04-06 DIAGNOSIS — Z0000 Encounter for general adult medical examination without abnormal findings: Secondary | ICD-10-CM | POA: Diagnosis not present

## 2022-04-06 DIAGNOSIS — J309 Allergic rhinitis, unspecified: Secondary | ICD-10-CM | POA: Diagnosis not present

## 2022-04-07 ENCOUNTER — Other Ambulatory Visit: Payer: Self-pay | Admitting: Internal Medicine

## 2022-04-07 DIAGNOSIS — M8588 Other specified disorders of bone density and structure, other site: Secondary | ICD-10-CM

## 2022-04-12 ENCOUNTER — Other Ambulatory Visit: Payer: Self-pay | Admitting: Internal Medicine

## 2022-04-12 DIAGNOSIS — Z1231 Encounter for screening mammogram for malignant neoplasm of breast: Secondary | ICD-10-CM

## 2022-04-16 DIAGNOSIS — I350 Nonrheumatic aortic (valve) stenosis: Secondary | ICD-10-CM | POA: Diagnosis not present

## 2022-04-28 DIAGNOSIS — B349 Viral infection, unspecified: Secondary | ICD-10-CM | POA: Diagnosis not present

## 2022-07-21 DIAGNOSIS — R040 Epistaxis: Secondary | ICD-10-CM | POA: Diagnosis not present

## 2022-08-04 DIAGNOSIS — Z853 Personal history of malignant neoplasm of breast: Secondary | ICD-10-CM | POA: Diagnosis not present

## 2022-08-04 DIAGNOSIS — N644 Mastodynia: Secondary | ICD-10-CM | POA: Diagnosis not present

## 2022-08-05 ENCOUNTER — Other Ambulatory Visit: Payer: Self-pay | Admitting: Internal Medicine

## 2022-08-05 DIAGNOSIS — N644 Mastodynia: Secondary | ICD-10-CM

## 2022-08-17 ENCOUNTER — Ambulatory Visit: Payer: Medicare Other

## 2022-08-17 ENCOUNTER — Ambulatory Visit
Admission: RE | Admit: 2022-08-17 | Discharge: 2022-08-17 | Disposition: A | Discharge status: Home / Self Care | Payer: Medicare Other | Source: Ambulatory Visit | Attending: Internal Medicine | Admitting: Internal Medicine

## 2022-08-17 DIAGNOSIS — N644 Mastodynia: Secondary | ICD-10-CM | POA: Diagnosis not present

## 2022-09-03 DIAGNOSIS — R1030 Lower abdominal pain, unspecified: Secondary | ICD-10-CM | POA: Diagnosis not present

## 2022-09-03 DIAGNOSIS — K589 Irritable bowel syndrome without diarrhea: Secondary | ICD-10-CM | POA: Diagnosis not present

## 2022-10-01 ENCOUNTER — Ambulatory Visit: Payer: Medicare Other

## 2022-10-01 ENCOUNTER — Other Ambulatory Visit: Payer: Medicare Other

## 2022-10-22 DIAGNOSIS — D1801 Hemangioma of skin and subcutaneous tissue: Secondary | ICD-10-CM | POA: Diagnosis not present

## 2022-10-22 DIAGNOSIS — L720 Epidermal cyst: Secondary | ICD-10-CM | POA: Diagnosis not present

## 2022-10-22 DIAGNOSIS — L409 Psoriasis, unspecified: Secondary | ICD-10-CM | POA: Diagnosis not present

## 2022-10-22 DIAGNOSIS — L821 Other seborrheic keratosis: Secondary | ICD-10-CM | POA: Diagnosis not present

## 2022-10-22 DIAGNOSIS — L814 Other melanin hyperpigmentation: Secondary | ICD-10-CM | POA: Diagnosis not present

## 2023-01-04 DIAGNOSIS — M8588 Other specified disorders of bone density and structure, other site: Secondary | ICD-10-CM | POA: Diagnosis not present

## 2023-01-04 DIAGNOSIS — K219 Gastro-esophageal reflux disease without esophagitis: Secondary | ICD-10-CM | POA: Diagnosis not present

## 2023-01-04 DIAGNOSIS — F5101 Primary insomnia: Secondary | ICD-10-CM | POA: Diagnosis not present

## 2023-01-04 DIAGNOSIS — E7800 Pure hypercholesterolemia, unspecified: Secondary | ICD-10-CM | POA: Diagnosis not present

## 2023-01-04 DIAGNOSIS — I10 Essential (primary) hypertension: Secondary | ICD-10-CM | POA: Diagnosis not present

## 2023-01-04 DIAGNOSIS — K589 Irritable bowel syndrome without diarrhea: Secondary | ICD-10-CM | POA: Diagnosis not present

## 2023-03-14 ENCOUNTER — Inpatient Hospital Stay
Admission: RE | Admit: 2023-03-14 | Discharge: 2023-03-14 | Disposition: A | Discharge status: Home / Self Care | Payer: Medicare Other | Source: Ambulatory Visit | Attending: Internal Medicine

## 2023-03-14 DIAGNOSIS — M8588 Other specified disorders of bone density and structure, other site: Secondary | ICD-10-CM | POA: Diagnosis not present

## 2023-04-11 DIAGNOSIS — E785 Hyperlipidemia, unspecified: Secondary | ICD-10-CM | POA: Diagnosis not present

## 2023-04-11 DIAGNOSIS — M8580 Other specified disorders of bone density and structure, unspecified site: Secondary | ICD-10-CM | POA: Diagnosis not present

## 2023-04-11 DIAGNOSIS — R030 Elevated blood-pressure reading, without diagnosis of hypertension: Secondary | ICD-10-CM | POA: Diagnosis not present

## 2023-04-11 DIAGNOSIS — K219 Gastro-esophageal reflux disease without esophagitis: Secondary | ICD-10-CM | POA: Diagnosis not present

## 2023-04-11 DIAGNOSIS — R011 Cardiac murmur, unspecified: Secondary | ICD-10-CM | POA: Diagnosis not present

## 2023-05-08 IMAGING — CR DG SHOULDER 2+V*L*
3 series · 3 of 3 positions shown · non-contrast
Comparison: None.

CLINICAL DATA: Left shoulder blade pain. Posteroinferior scapular
pain for 1 week.

EXAM:
LEFT SHOULDER - 2+ VIEW

[w shoulder ap internal left *]
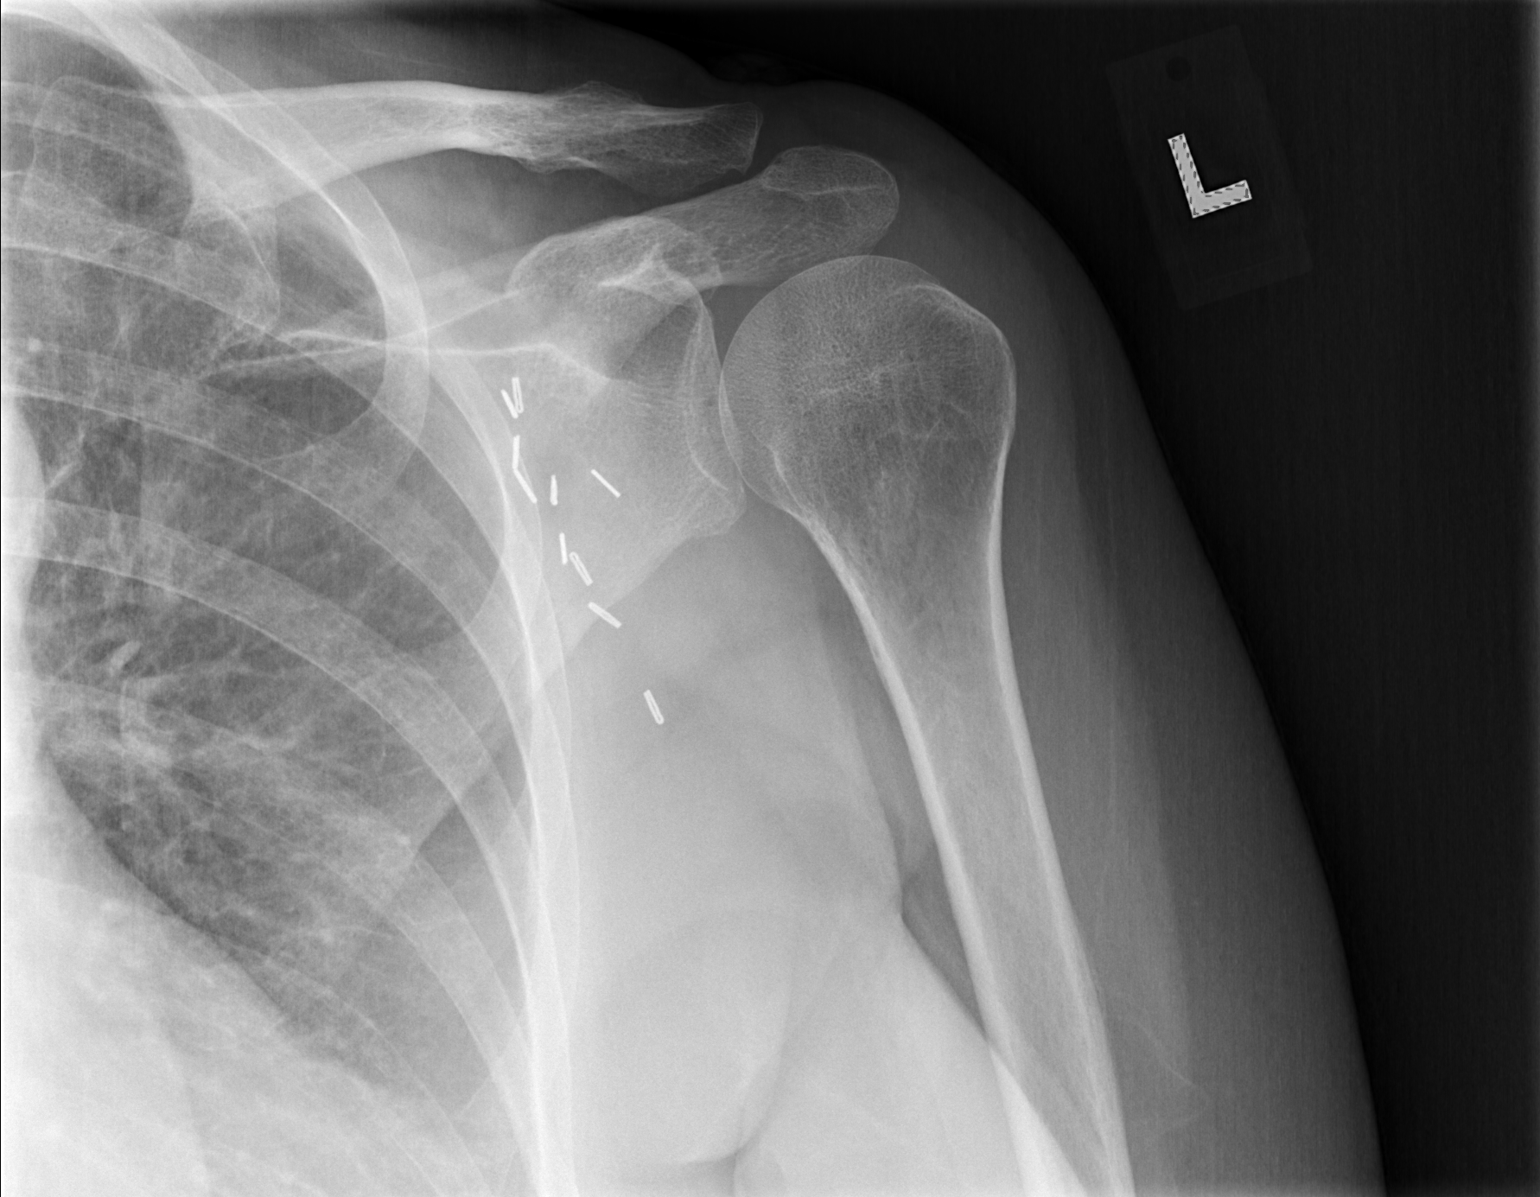

[w shoulder y view left *]
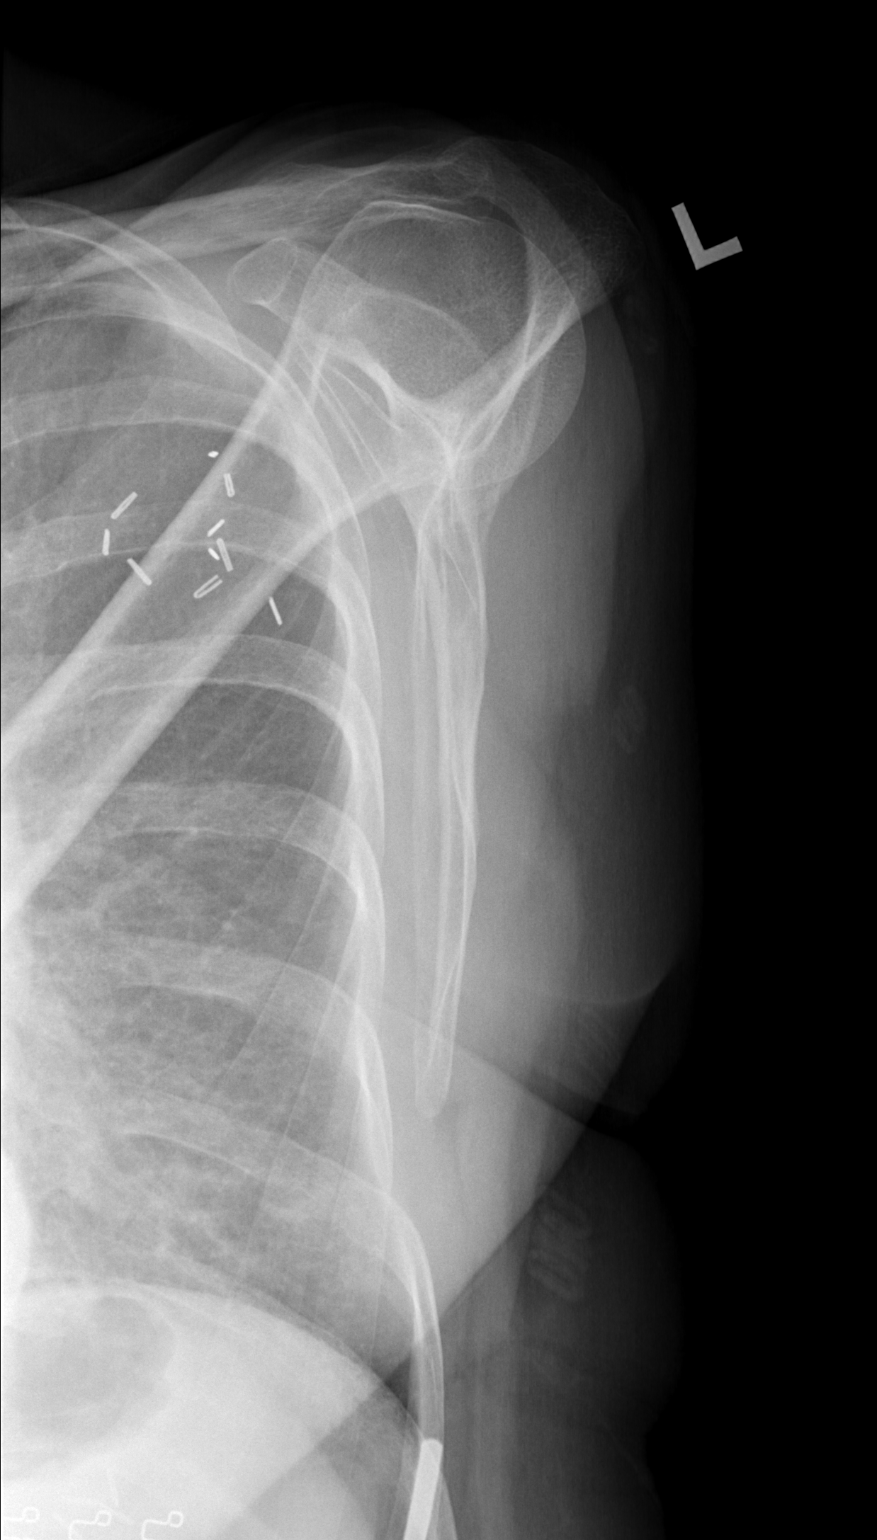

[w shoulder axillary left *]
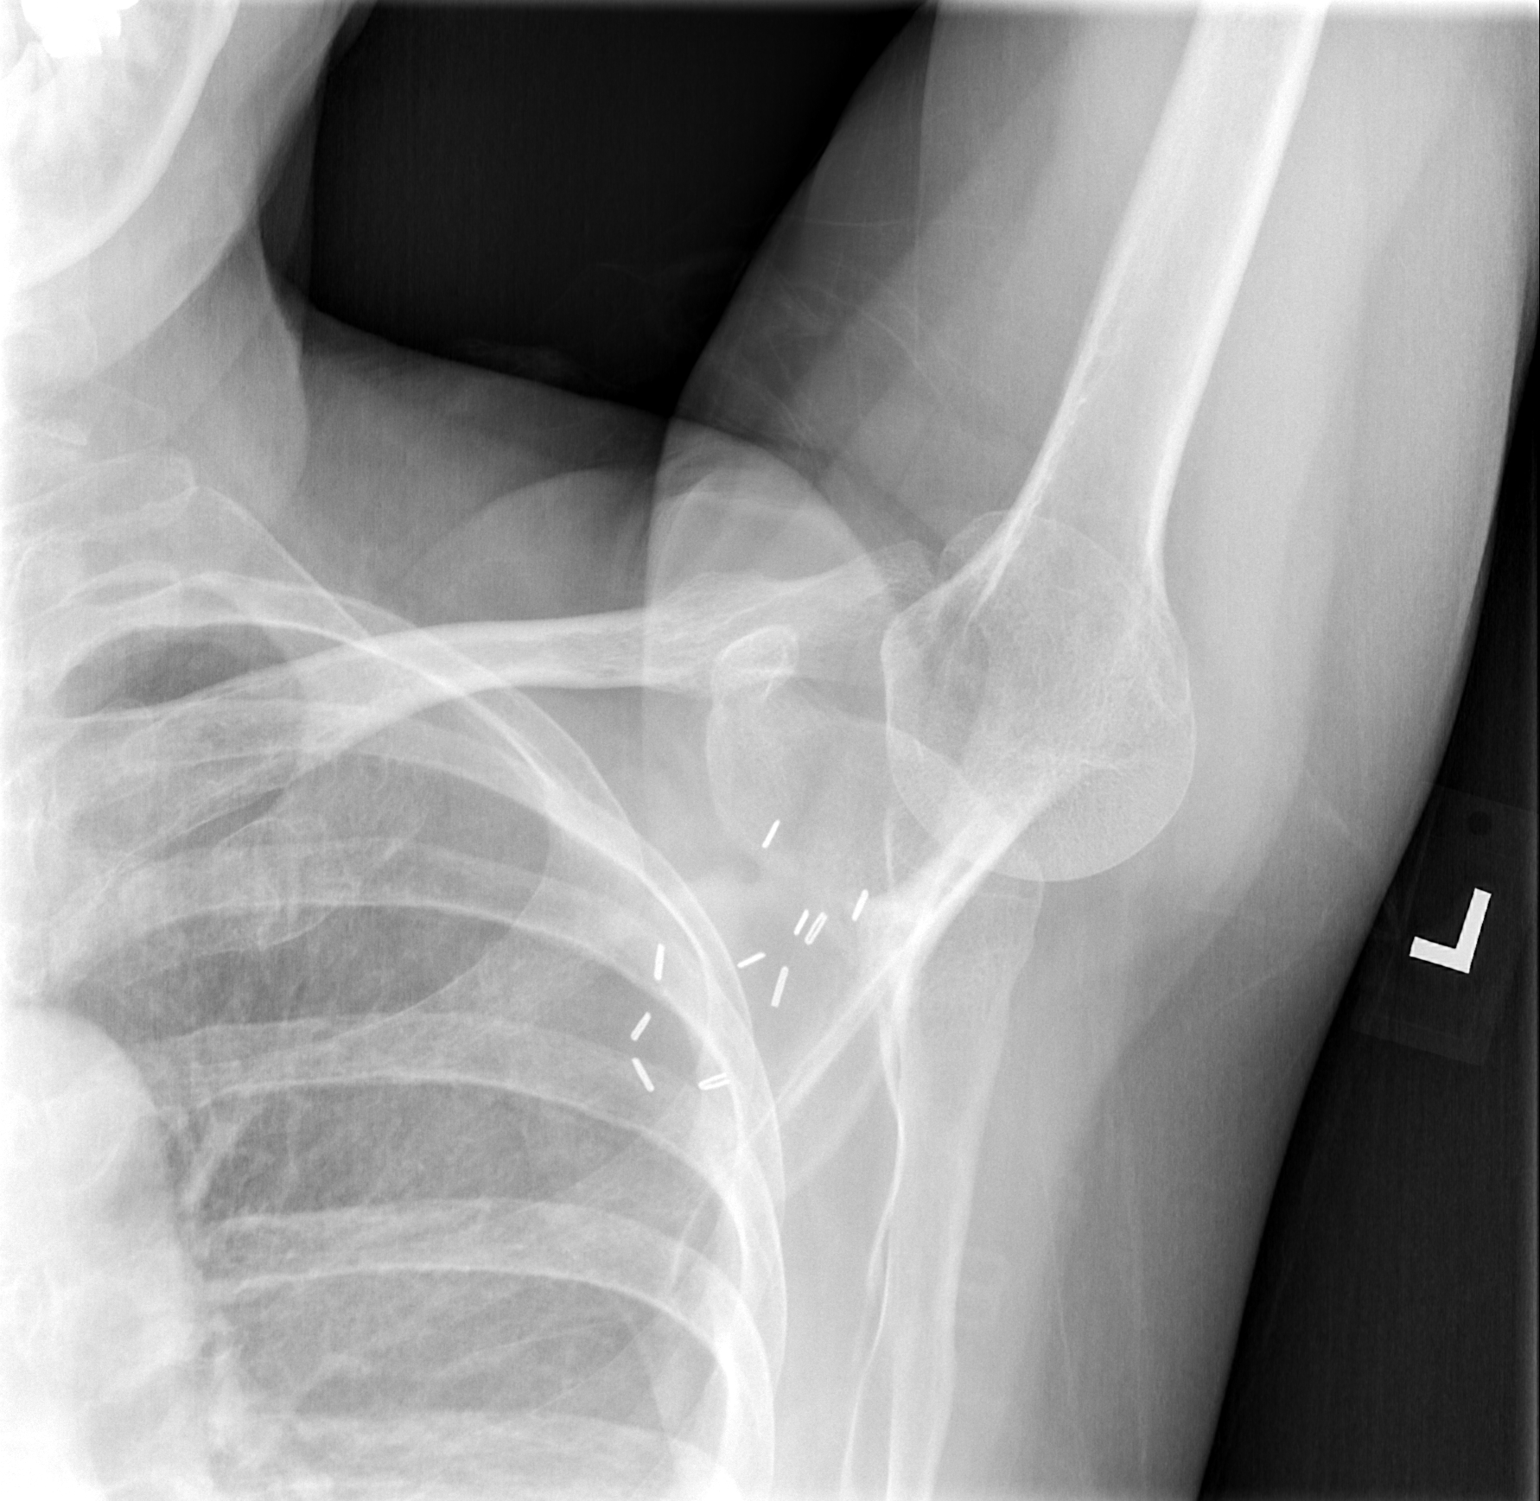

[3 of 3 positions shown; findings below may reference images not displayed]

FINDINGS: Mildly decreased bone mineralization. Mild inferior glenoid
degenerative osteophytosis. Mild peripheral clavicular head
degenerative spurring. Mild inferior acromioclavicular joint space
narrowing. No acute fracture or dislocation. Surgical clips overlie
the left axilla.
IMPRESSION: Very mild acromioclavicular and glenohumeral osteoarthritis.
